# Patient Record
Sex: Male | Born: 1965 | Race: White | Hispanic: No | Marital: Married | Smoking: Current every day smoker
Health system: Southern US, Community
[De-identification: ages and names within clinical notes are randomized; demographics above are authoritative.]

## PROBLEM LIST (undated history)

## (undated) DIAGNOSIS — K5792 Diverticulitis of intestine, part unspecified, without perforation or abscess without bleeding: Secondary | ICD-10-CM

## (undated) DIAGNOSIS — E7800 Pure hypercholesterolemia, unspecified: Secondary | ICD-10-CM

## (undated) DIAGNOSIS — I10 Essential (primary) hypertension: Secondary | ICD-10-CM

## (undated) DIAGNOSIS — F32A Depression, unspecified: Secondary | ICD-10-CM

## (undated) DIAGNOSIS — F419 Anxiety disorder, unspecified: Secondary | ICD-10-CM

## (undated) DIAGNOSIS — F329 Major depressive disorder, single episode, unspecified: Secondary | ICD-10-CM

## (undated) DIAGNOSIS — T7840XA Allergy, unspecified, initial encounter: Secondary | ICD-10-CM

## (undated) DIAGNOSIS — K219 Gastro-esophageal reflux disease without esophagitis: Secondary | ICD-10-CM

## (undated) HISTORY — DX: Allergy, unspecified, initial encounter: T78.40XA

## (undated) HISTORY — DX: Gastro-esophageal reflux disease without esophagitis: K21.9

## (undated) HISTORY — DX: Diverticulitis of intestine, part unspecified, without perforation or abscess without bleeding: K57.92

## (undated) HISTORY — PX: OTHER SURGICAL HISTORY: SHX169

## (undated) HISTORY — PX: APPENDECTOMY: SHX54

---
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
## 2003-05-17 ENCOUNTER — Encounter: Payer: Self-pay | Admitting: Occupational Medicine

## 2003-05-17 ENCOUNTER — Encounter: Admission: RE | Admit: 2003-05-17 | Discharge: 2003-05-17 | Payer: Self-pay | Admitting: Occupational Medicine

## 2019-01-19 ENCOUNTER — Other Ambulatory Visit: Payer: Self-pay

## 2019-01-19 ENCOUNTER — Other Ambulatory Visit: Payer: Self-pay | Admitting: Behavioral Health

## 2019-01-19 ENCOUNTER — Encounter (HOSPITAL_COMMUNITY): Payer: Self-pay

## 2019-01-19 ENCOUNTER — Inpatient Hospital Stay (HOSPITAL_COMMUNITY)
Admission: RE | Admit: 2019-01-19 | Discharge: 2019-01-26 | DRG: 885 | Disposition: A | Discharge status: Home / Self Care | Payer: Commercial Managed Care - PPO | Attending: Psychiatry | Admitting: Psychiatry

## 2019-01-19 DIAGNOSIS — F32A Depression, unspecified: Secondary | ICD-10-CM | POA: Diagnosis present

## 2019-01-19 DIAGNOSIS — F1010 Alcohol abuse, uncomplicated: Secondary | ICD-10-CM | POA: Diagnosis not present

## 2019-01-19 DIAGNOSIS — R45851 Suicidal ideations: Secondary | ICD-10-CM | POA: Diagnosis present

## 2019-01-19 DIAGNOSIS — G4700 Insomnia, unspecified: Secondary | ICD-10-CM | POA: Diagnosis present

## 2019-01-19 DIAGNOSIS — I10 Essential (primary) hypertension: Secondary | ICD-10-CM | POA: Diagnosis present

## 2019-01-19 DIAGNOSIS — F322 Major depressive disorder, single episode, severe without psychotic features: Secondary | ICD-10-CM

## 2019-01-19 DIAGNOSIS — Z1159 Encounter for screening for other viral diseases: Secondary | ICD-10-CM

## 2019-01-19 DIAGNOSIS — F528 Other sexual dysfunction not due to a substance or known physiological condition: Secondary | ICD-10-CM | POA: Diagnosis present

## 2019-01-19 DIAGNOSIS — F329 Major depressive disorder, single episode, unspecified: Secondary | ICD-10-CM | POA: Diagnosis present

## 2019-01-19 DIAGNOSIS — F66 Other sexual disorders: Secondary | ICD-10-CM | POA: Diagnosis not present

## 2019-01-19 DIAGNOSIS — E785 Hyperlipidemia, unspecified: Secondary | ICD-10-CM | POA: Diagnosis present

## 2019-01-19 LAB — SARS CORONAVIRUS 2 BY RT PCR (HOSPITAL ORDER, PERFORMED IN ~~LOC~~ HOSPITAL LAB): SARS Coronavirus 2: NEGATIVE

## 2019-01-19 MED ORDER — ALUM & MAG HYDROXIDE-SIMETH 200-200-20 MG/5ML PO SUSP: 30.0000 mL | ORAL | Status: DC | PRN

## 2019-01-19 MED ORDER — FENOFIBRATE 160 MG PO TABS
160.0000 mg | ORAL_TABLET | Freq: Every day | ORAL | Status: DC
  Administered 2019-01-19 – 2019-01-26 (×8): 160 mg via ORAL
  Filled 2019-01-19 (×10): qty 1

## 2019-01-19 MED ORDER — LISINOPRIL-HYDROCHLOROTHIAZIDE 20-25 MG PO TABS: 1.0000 | ORAL_TABLET | Freq: Every day | ORAL | Status: DC

## 2019-01-19 MED ORDER — HYDROCHLOROTHIAZIDE 25 MG PO TABS
25.0000 mg | ORAL_TABLET | Freq: Every day | ORAL | Status: DC
  Administered 2019-01-19 – 2019-01-26 (×8): 25 mg via ORAL
  Filled 2019-01-19 (×11): qty 1

## 2019-01-19 MED ORDER — HYDROXYZINE HCL 25 MG PO TABS
25.0000 mg | ORAL_TABLET | Freq: Three times a day (TID) | ORAL | Status: DC | PRN
  Administered 2019-01-19: 25 mg via ORAL
  Filled 2019-01-19: qty 1

## 2019-01-19 MED ORDER — TRAZODONE HCL 50 MG PO TABS
50.0000 mg | ORAL_TABLET | Freq: Every evening | ORAL | Status: DC | PRN
  Administered 2019-01-19: 21:00:00 50 mg via ORAL
  Filled 2019-01-19: qty 1

## 2019-01-19 MED ORDER — ACETAMINOPHEN 325 MG PO TABS: 650.0000 mg | ORAL_TABLET | Freq: Four times a day (QID) | ORAL | Status: DC | PRN

## 2019-01-19 MED ORDER — LISINOPRIL 20 MG PO TABS
20.0000 mg | ORAL_TABLET | Freq: Every day | ORAL | Status: DC
  Administered 2019-01-19 – 2019-01-26 (×8): 20 mg via ORAL
  Filled 2019-01-19 (×10): qty 1
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
## 2019-01-19 NOTE — Plan of Care (Signed)
D: Patient is sitting on his bed and very anxious on approach. Patient is alert and cooperative. Denies SI, HI, AVH, and verbally contracts for safety. Patient blood pressure is elevated and provider on call contacted to get home meds ordered and administered. Patient denies physical symptoms/pain.    A: Medications administered per MD order. Support provided. Patient educated on safety on the unit and medications. Routine safety checks every 15 minutes. Patient stated understanding to tell nurse about any new physical symptoms. Patient understands to tell staff of any needs.     R: No adverse drug reactions noted. Patient verbally contracts for safety. Patient remains safe at this time and will continue to monitor.   Problem: Education: Goal: Knowledge of [facility] Education information/materials will improve Outcome: Progressing   Problem: Safety: Goal: Periods of time without injury will increase Outcome: Progressing  Patient is oriented to the unit. Patient remains safe and will continue to monitor.   [place] NOVEL CORONAVIRUS (COVID-19) DAILY CHECK-OFF SYMPTOMS - answer yes or no to each - every day NO YES  Have you had a fever in the past 24 hours?  Fever (Temp > 37.80C / 100F) X   Have you had any of these symptoms in the past 24 hours? New Cough  Sore Throat   Shortness of Breath  Difficulty Breathing  Unexplained Body Aches   X   Have you had any one of these symptoms in the past 24 hours not related to allergies?   Runny Nose  Nasal Congestion  Sneezing   X   If you have had runny nose, nasal congestion, sneezing in the past 24 hours, has it worsened?  X   EXPOSURES - check yes or no X   Have you traveled outside the state in the past 14 days?  X   Have you been in contact with someone with a confirmed diagnosis of COVID-19 or PUI in the past 14 days without wearing appropriate PPE?  X   Have you been living in the same home as a person with confirmed  diagnosis of COVID-19 or a PUI (household contact)?    X   Have you been diagnosed with COVID-19?    X              What to do next: Answered NO to all: Answered YES to anything:   Proceed with unit schedule Follow the [facility] Inpatient Flowsheet.

## 2019-01-20 DIAGNOSIS — F322 Major depressive disorder, single episode, severe without psychotic features: Principal | ICD-10-CM

## 2019-01-20 MED ORDER — VITAMIN B-1 100 MG PO TABS
100.0000 mg | ORAL_TABLET | Freq: Every day | ORAL | Status: DC
  Administered 2019-01-21 – 2019-01-26 (×6): 100 mg via ORAL
  Filled 2019-01-20 (×8): qty 1

## 2019-01-20 MED ORDER — CITALOPRAM HYDROBROMIDE 10 MG PO TABS
ORAL_TABLET | ORAL | Status: AC
  Filled 2019-01-20: qty 1

## 2019-01-20 MED ORDER — THIAMINE HCL 100 MG/ML IJ SOLN: 100.0000 mg | Freq: Once | INTRAMUSCULAR | Status: DC

## 2019-01-20 MED ORDER — ONDANSETRON 4 MG PO TBDP: 4.0000 mg | ORAL_TABLET | Freq: Four times a day (QID) | ORAL | Status: AC | PRN

## 2019-01-20 MED ORDER — CITALOPRAM HYDROBROMIDE 10 MG PO TABS
10.0000 mg | ORAL_TABLET | Freq: Every day | ORAL | Status: DC
  Administered 2019-01-20 – 2019-01-22 (×3): 10 mg via ORAL
  Filled 2019-01-20 (×5): qty 1

## 2019-01-20 MED ORDER — LORAZEPAM 1 MG PO TABS: 1.0000 mg | ORAL_TABLET | Freq: Four times a day (QID) | ORAL | Status: AC | PRN

## 2019-01-20 MED ORDER — LOPERAMIDE HCL 2 MG PO CAPS: 2.0000 mg | ORAL_CAPSULE | ORAL | Status: AC | PRN

## 2019-01-20 MED ORDER — TRAZODONE HCL 50 MG PO TABS
50.0000 mg | ORAL_TABLET | Freq: Every evening | ORAL | Status: DC | PRN
  Administered 2019-01-20 – 2019-01-25 (×6): 50 mg via ORAL
  Filled 2019-01-20 (×7): qty 1

## 2019-01-20 MED ORDER — ADULT MULTIVITAMIN W/MINERALS CH
1.0000 | ORAL_TABLET | Freq: Every day | ORAL | Status: DC
  Administered 2019-01-20 – 2019-01-26 (×7): 1 via ORAL
  Filled 2019-01-20 (×9): qty 1

## 2019-01-20 MED ORDER — HYDROXYZINE HCL 25 MG PO TABS
25.0000 mg | ORAL_TABLET | Freq: Four times a day (QID) | ORAL | Status: AC | PRN
  Administered 2019-01-20 – 2019-01-22 (×3): 25 mg via ORAL
  Filled 2019-01-20 (×4): qty 1

## 2019-01-20 NOTE — Progress Notes (Signed)
D Pt is observed by this Probation officer, he wears his own clothese. They are wrinkled and he says he slept in them last night. He endorses a flat, tearful affect6. HE avoids making eye contact with this Probation officer. He makes statements " it'll never be the same....people will always think differenlty of me..myalgias life is over".      A He has spent the majority of this day, up to the present sitting alone on the side of his bed. When writer checked on him, he does not asknowledge this writer's presence in the room, choosing to stare straight ahead. Writer shared her safety concerns with Dr Parke Poisson and we both sopoke to the patient in his room, together. ]    R Pt completed his daily assessment and on this he wrote he has experienced SI today . He says, quite flatly and blankly that he is not thinking of ways to kill himslef and he denies active SI, to both this Probation officer and to Dr Parke Poisson. HE rates his depression, hopelessness and anxeity " 8/10/6". He demonstrates little to no insight into his mental state. HE cannot look forward to anything, he cannot imagine his life, how he will feelel, what it will truly be like, say for example, n=tomorrow and / or even next week. He is hopeless and helpless. Staff to continue to contract with pt an dhceck on pt q 15 min and oprn to assure pt a=stays safe.
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
## 2019-01-20 NOTE — Final Progress Note (Signed)
[place] NOVEL CORONAVIRUS (COVID-19) DAILY CHECK-OFF SYMPTOMS - answer yes or no to each - every day NO YES  Have you had a fever in the past 24 hours?  . Fever (Temp > 37.80C / 100F) X   Have you had any of these symptoms in the past 24 hours? . New Cough .  Sore Throat  .  Shortness of Breath .  Difficulty Breathing .  Unexplained Body Aches   X   Have you had any one of these symptoms in the past 24 hours not related to allergies?   . Runny Nose .  Nasal Congestion .  Sneezing   X   If you have had runny nose, nasal congestion, sneezing in the past 24 hours, has it worsened?  X   EXPOSURES - check yes or no X   Have you traveled outside the state in the past 14 days?  X   Have you been in contact with someone with a confirmed diagnosis of COVID-19 or PUI in the past 14 days without wearing appropriate PPE?  X   Have you been living in the same home as a person with confirmed diagnosis of COVID-19 or a PUI (household contact)?    X   Have you been diagnosed with COVID-19?    X              What to do next: Answered NO to all: Answered YES to anything:   Proceed with unit schedule Follow the [facility] Inpatient Flowsheet.   

## 2019-01-20 NOTE — Tx Team (Signed)
Interdisciplinary Treatment and Diagnostic Plan Update  01/20/2019 Time of Session:  Victor Boyer MRN: [id number]  Principal Diagnosis: <principal problem not specified>  Secondary Diagnoses: Active Problems:   Depression   Current Medications:  Current Facility-Administered Medications  Medication Dose Route Frequency Provider Last Rate Last Dose  . acetaminophen (TYLENOL) tablet 650 mg  650 mg Oral Q6H PRN Thomas, Lashunda, NP      . alum & mag hydroxide-simeth (MAALOX/MYLANTA) 200-200-20 MG/5ML suspension 30 mL  30 mL Oral Q4H PRN Thomas, Lashunda, NP      . fenofibrate tablet 160 mg  160 mg Oral Daily Berry, Jason A, NP   160 mg at 01/20/19 0846  . lisinopril (ZESTRIL) tablet 20 mg  20 mg Oral Daily Kumar, Archana, MD   20 mg at 01/20/19 0846   And  . hydrochlorothiazide (HYDRODIURIL) tablet 25 mg  25 mg Oral Daily Kumar, Archana, MD   25 mg at 01/20/19 0846  . hydrOXYzine (ATARAX/VISTARIL) tablet 25 mg  25 mg Oral TID PRN Berry, Jason A, NP   25 mg at 01/19/19 2123  . traZODone (DESYREL) tablet 50 mg  50 mg Oral QHS PRN,MR X 1 Berry, Jason A, NP   50 mg at 01/19/19 2123   PTA Medications: Medications Prior to Admission  Medication Sig Dispense Refill Last Dose  . fenofibrate 160 MG tablet Take 160 mg by mouth daily. with food     . lisinopril-hydrochlorothiazide (ZESTORETIC) 20-25 MG tablet Take 1 tablet by mouth daily.       Patient Stressors: Financial difficulties Legal issue Traumatic event  Patient Strengths: Ability for insight Average or above average intelligence Communication skills Financial means Motivation for treatment/growth Physical Health Supportive family/friends  Treatment Modalities: Medication Management, Group therapy, Case management,  1 to 1 session with clinician, Psychoeducation, Recreational therapy.   Physician Treatment Plan for Primary Diagnosis: <principal problem not specified> Long Term Goal(s):     Short Term Goals:    Medication  Management: Evaluate patient's response, side effects, and tolerance of medication regimen.  Therapeutic Interventions: 1 to 1 sessions, Unit Group sessions and Medication administration.  Evaluation of Outcomes: Not Met  Physician Treatment Plan for Secondary Diagnosis: Active Problems:   Depression  Long Term Goal(s):     Short Term Goals:       Medication Management: Evaluate patient's response, side effects, and tolerance of medication regimen.  Therapeutic Interventions: 1 to 1 sessions, Unit Group sessions and Medication administration.  Evaluation of Outcomes: Not Met   RN Treatment Plan for Primary Diagnosis: <principal problem not specified> Long Term Goal(s): Knowledge of disease and therapeutic regimen to maintain health will improve  Short Term Goals: Ability to participate in decision making will improve, Ability to verbalize feelings will improve, Ability to disclose and discuss suicidal ideas and Ability to identify and develop effective coping behaviors will improve  Medication Management: RN will administer medications as ordered by provider, will assess and evaluate patient's response and provide education to patient for prescribed medication. RN will report any adverse and/or side effects to prescribing provider.  Therapeutic Interventions: 1 on 1 counseling sessions, Psychoeducation, Medication administration, Evaluate responses to treatment, Monitor vital signs and CBGs as ordered, Perform/monitor CIWA, COWS, AIMS and Fall Risk screenings as ordered, Perform wound care treatments as ordered.  Evaluation of Outcomes: Not Met   LCSW Treatment Plan for Primary Diagnosis: <principal problem not specified> Long Term Goal(s): Safe transition to appropriate next level of care at discharge, Engage patient in   therapeutic group addressing interpersonal concerns.  Short Term Goals: Engage patient in aftercare planning with referrals and resources  Therapeutic  Interventions: Assess for all discharge needs, 1 to 1 time with Social worker, Explore available resources and support systems, Assess for adequacy in community support network, Educate family and significant other(s) on suicide prevention, Complete Psychosocial Assessment, Interpersonal group therapy.  Evaluation of Outcomes: Not Met   Progress in Treatment: Attending groups: No. Participating in groups: No. Taking medication as prescribed: Yes. Toleration medication: Yes. Family/Significant other contact made: No, will contact:  if patient consents to collateral contacts Patient understands diagnosis: Yes. Discussing patient identified problems/goals with staff: Yes. Medical problems stabilized or resolved: Yes. Denies suicidal/homicidal ideation: Yes. Issues/concerns per patient self-inventory: No. Other:   New problem(s) identified: None   New Short Term/Long Term Goal(s): medication stabilization, elimination of SI thoughts, development of comprehensive mental wellness plan.    Patient Goals:    Discharge Plan or Barriers: Patient recently admitted. CSW will continue to follow and assess for appropriate referrals and possible discharge planning.    Reason for Continuation of Hospitalization: Depression Medication stabilization Suicidal ideation  Estimated Length of Stay: 3-5 days   Attendees: Patient: 01/20/2019 12:47 PM  Physician: Dr. Fernando Cobos, MD 01/20/2019 12:47 PM  Nursing: Patty.D, RN 01/20/2019 12:47 PM  RN Care Manager: 01/20/2019 12:47 PM  Social Worker: Jolan Williams, LCSWA 01/20/2019 12:47 PM  Recreational Therapist:  01/20/2019 12:47 PM  Other:  01/20/2019 12:47 PM  Other:  01/20/2019 12:47 PM  Other: 01/20/2019 12:47 PM    Scribe for Treatment Team: Jolan E Williams, LCSWA 01/20/2019 12:47 PM 
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
## 2019-01-21 DIAGNOSIS — F329 Major depressive disorder, single episode, unspecified: Secondary | ICD-10-CM

## 2019-01-21 LAB — CBC WITH DIFFERENTIAL/PLATELET
Abs Immature Granulocytes: 0.06 10*3/uL (ref 0.00–0.07)
Basophils Absolute: 0.1 10*3/uL (ref 0.0–0.1)
Basophils Relative: 1 %
Eosinophils Absolute: 0.3 10*3/uL (ref 0.0–0.5)
Eosinophils Relative: 3 %
HCT: 51.4 % (ref 39.0–52.0)
Hemoglobin: 16.7 g/dL (ref 13.0–17.0)
Immature Granulocytes: 1 %
Lymphocytes Relative: 22 %
Lymphs Abs: 2.8 10*3/uL (ref 0.7–4.0)
MCH: 30.5 pg (ref 26.0–34.0)
MCHC: 32.5 g/dL (ref 30.0–36.0)
MCV: 93.8 fL (ref 80.0–100.0)
Monocytes Absolute: 1.4 10*3/uL — ABNORMAL HIGH (ref 0.1–1.0)
Monocytes Relative: 11 %
Neutro Abs: 8 10*3/uL — ABNORMAL HIGH (ref 1.7–7.7)
Neutrophils Relative %: 62 %
Platelets: 357 10*3/uL (ref 150–400)
RBC: 5.48 MIL/uL (ref 4.22–5.81)
RDW: 13.1 % (ref 11.5–15.5)
WBC: 12.6 10*3/uL — ABNORMAL HIGH (ref 4.0–10.5)
nRBC: 0 % (ref 0.0–0.2)

## 2019-01-21 LAB — LIPID PANEL
Cholesterol: 203 mg/dL — ABNORMAL HIGH (ref 0–200)
HDL: 48 mg/dL (ref >40)
LDL Cholesterol: 128 mg/dL — ABNORMAL HIGH (ref 0–99)
Total CHOL/HDL Ratio: 4.2 RATIO
Triglycerides: 135 mg/dL (ref <150)
VLDL: 27 mg/dL (ref 0–40)

## 2019-01-21 LAB — COMPREHENSIVE METABOLIC PANEL
ALT: 16 U/L (ref 0–44)
AST: 14 U/L — ABNORMAL LOW (ref 15–41)
Albumin: 4.1 g/dL (ref 3.5–5.0)
Alkaline Phosphatase: 60 U/L (ref 38–126)
Anion gap: 12 (ref 5–15)
BUN: 24 mg/dL — ABNORMAL HIGH (ref 6–20)
CO2: 27 mmol/L (ref 22–32)
Calcium: 9.6 mg/dL (ref 8.9–10.3)
Chloride: 98 mmol/L (ref 98–111)
Creatinine, Ser: 0.99 mg/dL (ref 0.61–1.24)
GFR calc Af Amer: >60 mL/min (ref >60)
GFR calc non Af Amer: >60 mL/min (ref >60)
Glucose, Bld: 103 mg/dL — ABNORMAL HIGH (ref 70–99)
Potassium: 4.1 mmol/L (ref 3.5–5.1)
Sodium: 137 mmol/L (ref 135–145)
Total Bilirubin: 0.4 mg/dL (ref 0.3–1.2)
Total Protein: 7.5 g/dL (ref 6.5–8.1)

## 2019-01-21 LAB — RAPID URINE DRUG SCREEN, HOSP PERFORMED
Amphetamines: NOT DETECTED
Barbiturates: NOT DETECTED
Benzodiazepines: NOT DETECTED
Cocaine: NOT DETECTED
Opiates: NOT DETECTED
Tetrahydrocannabinol: NOT DETECTED

## 2019-01-21 LAB — URINALYSIS, COMPLETE (UACMP) WITH MICROSCOPIC
Bacteria, UA: NONE SEEN
Bilirubin Urine: NEGATIVE
Glucose, UA: NEGATIVE mg/dL
Ketones, ur: NEGATIVE mg/dL
Leukocytes,Ua: NEGATIVE
Nitrite: NEGATIVE
Protein, ur: NEGATIVE mg/dL
Specific Gravity, Urine: 1.018 (ref 1.005–1.030)
pH: 5 (ref 5.0–8.0)

## 2019-01-21 LAB — TSH: TSH: 2.882 u[IU]/mL (ref 0.350–4.500)

## 2019-01-21 MED ORDER — NICOTINE 21 MG/24HR TD PT24
MEDICATED_PATCH | TRANSDERMAL | Status: AC
  Filled 2019-01-21: qty 1

## 2019-01-21 MED ORDER — NICOTINE 21 MG/24HR TD PT24
21.0000 mg | MEDICATED_PATCH | Freq: Every day | TRANSDERMAL | Status: DC
  Administered 2019-01-21 – 2019-01-25 (×5): 21 mg via TRANSDERMAL
  Filled 2019-01-21 (×7): qty 1

## 2019-01-21 NOTE — Progress Notes (Signed)
D Pt is observed OOB UAL on the 400 hall today. He is in the dayroom and says to this Probation officer " its better todday". He asksed for a nicoderm patch and said " this will help me..ibuprofen know"> He remains acutely depressed and endorses a flat, depressed affect. He wears the same clothes he wore yesterday.     A He completed his daily assessment and on this he wrote he denied SI today and he rated his depression, hopelessness and anxiety " 8/8/4", respectively. He attended his Life SKills group today , was engaged in the conversation, made eye contact several times with this Probation officer as this Probation officer mediated this group and toelrated this discussion without repercussion. He denied having active SI today and he rated his dperession, hopelessness and anxiety " 8/8/4", respectively.     R Safety is in place Risk analyst to cont to forge therapeutic relationship with pt.

## 2019-01-21 NOTE — Progress Notes (Signed)
[place] NOVEL CORONAVIRUS (COVID-19) DAILY CHECK-OFF SYMPTOMS - answer yes or no to each - every day NO YES  Have you had a fever in the past 24 hours?  . Fever (Temp > 37.80C / 100F) X   Have you had any of these symptoms in the past 24 hours? . New Cough .  Sore Throat  .  Shortness of Breath .  Difficulty Breathing .  Unexplained Body Aches   X   Have you had any one of these symptoms in the past 24 hours not related to allergies?   . Runny Nose .  Nasal Congestion .  Sneezing   X   If you have had runny nose, nasal congestion, sneezing in the past 24 hours, has it worsened?  X   EXPOSURES - check yes or no X   Have you traveled outside the state in the past 14 days?  X   Have you been in contact with someone with a confirmed diagnosis of COVID-19 or PUI in the past 14 days without wearing appropriate PPE?  X   Have you been living in the same home as a person with confirmed diagnosis of COVID-19 or a PUI (household contact)?    X   Have you been diagnosed with COVID-19?    X              What to do next: Answered NO to all: Answered YES to anything:   Proceed with unit schedule Follow the [facility] Inpatient Flowsheet.   

## 2019-01-21 NOTE — Progress Notes (Addendum)
[facility] MD Progress Note  01/21/2019 4:03 PM Victor Boyer  MRN:  [id number] Subjective:  "I'm doing ok."  Victor Boyer found sitting in his room. He reports some improvement in mood. Range of affect is slightly improved. He has been in the dayroom for much of the day and has been participating in group therapy. He is interested in individual therapy after follow-up to address factors causing his depression. Denies current SI and contracts for safety. He was started on Celexa yesterday. He reports some mild dizziness but denies other side effects. He reports he has been drinking fluids and is steady on his feet. No withdrawal symptoms. Sleep reported as "fair." Record shows 6.75 hours of sleep. Appetite fair. He has been eating all of his meals (was only eating once a day at home).  From admission H&P: Victor Boyer is a 53 year old male with no reported psychiatric history, presenting voluntarily for treatment of suicidal ideation. He is tearful and declines to elaborate on reason for admission, other than to say that "something bad happened." Chart notes state that he was arrested on Monday for child pornography, and his two minor children were removed from the home in [place] custody. Patient reports being acutely depressed since the events earlier this week, with suicidal ideation, no plan.  Principal Problem: <principal problem not specified> Diagnosis: Active Problems:   Depression  Total Time spent with patient: 15 minutes  Past Psychiatric History: See admission H&P  Past Medical History: History reviewed. No pertinent past medical history. History reviewed. No pertinent surgical history. Family History: History reviewed. No pertinent family history. Family Psychiatric  History: See admission H&P Social History:  Social History   Substance and Sexual Activity  Alcohol Use Yes   Comment: 24 pack/week     Social History   Substance and Sexual Activity  Drug Use Never    Social History    Socioeconomic History  . Marital status: Married    Spouse name: Not on file  . Number of children: Not on file  . Years of education: Not on file  . Highest education level: Not on file  Occupational History  . Not on file  Social Needs  . Financial resource strain: Not on file  . Food insecurity    Worry: Not on file    Inability: Not on file  . Transportation needs    Medical: Not on file    Non-medical: Not on file  Tobacco Use  . Smoking status: Current Every Day Smoker    Packs/day: 1.00    Types: Cigarettes  . Smokeless tobacco: Never Used  Substance and Sexual Activity  . Alcohol use: Yes    Comment: 24 pack/week  . Drug use: Never  . Sexual activity: Yes    Birth control/protection: Coitus interruptus  Lifestyle  . Physical activity    Days per week: Not on file    Minutes per session: Not on file  . Stress: Not on file  Relationships  . Social Herbalist on phone: Not on file    Gets together: Not on file    Attends religious service: Not on file    Active member of club or organization: Not on file    Attends meetings of clubs or organizations: Not on file    Relationship status: Not on file  Other Topics Concern  . Not on file  Social History Narrative  . Not on file   Additional Social History:    Pain Medications:  see MAR Prescriptions: see MAR Over the Counter: see MAR History of alcohol / drug use?: No history of alcohol / drug abuse                    Sleep: Fair  Appetite:  Fair  Current Medications: Current Facility-Administered Medications  Medication Dose Route Frequency Provider Last Rate Last Dose  . acetaminophen (TYLENOL) tablet 650 mg  650 mg Oral Q6H PRN Mordecai Maes, NP      . alum & mag hydroxide-simeth (MAALOX/MYLANTA) 200-200-20 MG/5ML suspension 30 mL  30 mL Oral Q4H PRN Mordecai Maes, NP      . citalopram (CELEXA) tablet 10 mg  10 mg Oral Daily Tyjah Hai, Myer Peer, MD   10 mg at 01/21/19 0825  .  fenofibrate tablet 160 mg  160 mg Oral Daily Lindon Romp A, NP   160 mg at 01/21/19 0825  . lisinopril (ZESTRIL) tablet 20 mg  20 mg Oral Daily Hampton Abbot, MD   20 mg at 01/21/19 0825   And  . hydrochlorothiazide (HYDRODIURIL) tablet 25 mg  25 mg Oral Daily Hampton Abbot, MD   25 mg at 01/21/19 0826  . hydrOXYzine (ATARAX/VISTARIL) tablet 25 mg  25 mg Oral Q6H PRN Jaz Laningham, Myer Peer, MD   25 mg at 01/20/19 2124  . loperamide (IMODIUM) capsule 2-4 mg  2-4 mg Oral PRN Isack Lavalley, Myer Peer, MD      . LORazepam (ATIVAN) tablet 1 mg  1 mg Oral Q6H PRN Autum Benfer, Myer Peer, MD      . multivitamin with minerals tablet 1 tablet  1 tablet Oral Daily Raiya Stainback, Myer Peer, MD   1 tablet at 01/21/19 [phone]  . nicotine (NICODERM CQ - dosed in mg/24 hours) 21 mg/24hr patch           . nicotine (NICODERM CQ - dosed in mg/24 hours) patch 21 mg  21 mg Transdermal Daily Cabe Lashley, Myer Peer, MD   21 mg at 01/21/19 1133  . ondansetron (ZOFRAN-ODT) disintegrating tablet 4 mg  4 mg Oral Q6H PRN Sandeep Radell, Myer Peer, MD      . thiamine (B-1) injection 100 mg  100 mg Intramuscular Once Keano Guggenheim A, MD      . thiamine (VITAMIN B-1) tablet 100 mg  100 mg Oral Daily Rogelio Winbush, Myer Peer, MD   100 mg at 01/21/19 0825  . traZODone (DESYREL) tablet 50 mg  50 mg Oral QHS PRN Herminia Warren, Myer Peer, MD   50 mg at 01/20/19 2124    Lab Results:  Results for orders placed or performed during the hospital encounter of 01/19/19 (from the past 48 hour(s))  SARS Coronavirus 2 (CEPHEID - Performed in [facility] hospital lab), Hosp Order     Status: None   Collection Time: 01/19/19  5:27 PM   Specimen: Nasopharyngeal Swab  Result Value Ref Range   SARS Coronavirus 2 NEGATIVE NEGATIVE    Comment: (NOTE) If result is NEGATIVE SARS-CoV-2 target nucleic acids are NOT DETECTED. The SARS-CoV-2 RNA is generally detectable in upper and lower  respiratory specimens during the acute phase of infection. The lowest  concentration of SARS-CoV-2 viral  copies this assay can detect is 250  copies / mL. A negative result does not preclude SARS-CoV-2 infection  and should not be used as the sole basis for treatment or other  patient management decisions.  A negative result may occur with  improper specimen collection / handling, submission of specimen other  than nasopharyngeal  swab, presence of viral mutation(s) within the  areas targeted by this assay, and inadequate number of viral copies  (<250 copies / mL). A negative result must be combined with clinical  observations, patient history, and epidemiological information. If result is POSITIVE SARS-CoV-2 target nucleic acids are DETECTED. The SARS-CoV-2 RNA is generally detectable in upper and lower  respiratory specimens dur ing the acute phase of infection.  Positive  results are indicative of active infection with SARS-CoV-2.  Clinical  correlation with patient history and other diagnostic information is  necessary to determine patient infection status.  Positive results do  not rule out bacterial infection or co-infection with other viruses. If result is PRESUMPTIVE POSTIVE SARS-CoV-2 nucleic acids MAY BE PRESENT.   A presumptive positive result was obtained on the submitted specimen  and confirmed on repeat testing.  While 2019 novel coronavirus  (SARS-CoV-2) nucleic acids may be present in the submitted sample  additional confirmatory testing may be necessary for epidemiological  and / or clinical management purposes  to differentiate between  SARS-CoV-2 and other Sarbecovirus currently known to infect humans.  If clinically indicated additional testing with an alternate test  methodology [phone]) is advised. The SARS-CoV-2 RNA is generally  detectable in upper and lower respiratory sp ecimens during the acute  phase of infection. The expected result is Negative. Fact Sheet for Patients:  StrictlyIdeas.no Fact Sheet for Healthcare  Providers: BankingDealers.co.za This test is not yet approved or cleared by the [place] FDA and has been authorized for detection and/or diagnosis of SARS-CoV-2 by FDA under an Emergency Use Authorization (EUA).  This EUA will remain in effect (meaning this test can be used) for the duration of the COVID-19 declaration under Section 564(b)(1) of the Act, 21 U.S.C. section 360bbb-3(b)(1), unless the authorization is terminated or revoked sooner. Performed at [facility], [place] [address], [place], [place] [address]   Urine rapid drug screen (hosp performed)not at [facility]     Status: None   Collection Time: 01/20/19  6:09 PM  Result Value Ref Range   Opiates NONE DETECTED NONE DETECTED   Cocaine NONE DETECTED NONE DETECTED   Benzodiazepines NONE DETECTED NONE DETECTED   Amphetamines NONE DETECTED NONE DETECTED   Tetrahydrocannabinol NONE DETECTED NONE DETECTED   Barbiturates NONE DETECTED NONE DETECTED    Comment: (NOTE) DRUG SCREEN FOR MEDICAL PURPOSES ONLY.  IF CONFIRMATION IS NEEDED FOR ANY PURPOSE, NOTIFY LAB WITHIN 5 DAYS. LOWEST DETECTABLE LIMITS FOR URINE DRUG SCREEN Drug Class                     Cutoff (ng/mL) Amphetamine and metabolites    1000 Barbiturate and metabolites    200 Benzodiazepine                 384 Tricyclics and metabolites     300 Opiates and metabolites        300 Cocaine and metabolites        300 THC                            50 Performed at [facility], [place] [address], [place], [place] [address]   Urinalysis, Complete w Microscopic     Status: Abnormal   Collection Time: 01/20/19  6:09 PM  Result Value Ref Range   Color, Urine YELLOW YELLOW   APPearance CLEAR CLEAR   Specific Gravity, Urine 1.018 1.005 - 1.030  pH 5.0 5.0 - 8.0   Glucose, UA NEGATIVE NEGATIVE mg/dL   Hgb urine dipstick SMALL (A) NEGATIVE   Bilirubin Urine NEGATIVE NEGATIVE   Ketones, ur NEGATIVE NEGATIVE mg/dL    Protein, ur NEGATIVE NEGATIVE mg/dL   Nitrite NEGATIVE NEGATIVE   Leukocytes,Ua NEGATIVE NEGATIVE   RBC / HPF 0-5 0 - 5 RBC/hpf   WBC, UA 0-5 0 - 5 WBC/hpf   Bacteria, UA NONE SEEN NONE SEEN   Mucus PRESENT    Ca Oxalate Crys, UA PRESENT     Comment: Performed at [facility], [place] [address], [place], [place] [address]  Lipid panel     Status: Abnormal   Collection Time: 01/21/19  6:16 AM  Result Value Ref Range   Cholesterol 203 (H) 0 - 200 mg/dL   Triglycerides 135 <150 mg/dL   HDL 48 >40 mg/dL   Total CHOL/HDL Ratio 4.2 RATIO   VLDL 27 0 - 40 mg/dL   LDL Cholesterol 128 (H) 0 - 99 mg/dL    Comment:        Total Cholesterol/HDL:CHD Risk Coronary Heart Disease Risk Table                     Men   Women  1/2 Average Risk   3.4   3.3  Average Risk       5.0   4.4  2 X Average Risk   9.6   7.1  3 X Average Risk  23.4   11.0        Use the calculated Patient Ratio above and the CHD Risk Table to determine the patient's CHD Risk.        ATP III CLASSIFICATION (LDL):  <100     mg/dL   Optimal  100-129  mg/dL   Near or Above                    Optimal  130-159  mg/dL   Borderline  160-189  mg/dL   High  >190     mg/dL   Very High Performed at [place] [address], [place], [place] [address]   TSH     Status: None   Collection Time: 01/21/19  6:16 AM  Result Value Ref Range   TSH 2.882 0.350 - 4.500 uIU/mL    Comment: Performed by a 3rd Generation assay with a functional sensitivity of <=0.01 uIU/mL. Performed at [facility], [place] [address], [place], [place] [address]   CBC with Differential/Platelet     Status: Abnormal   Collection Time: 01/21/19  6:16 AM  Result Value Ref Range   WBC 12.6 (H) 4.0 - 10.5 K/uL   RBC 5.48 4.22 - 5.81 MIL/uL   Hemoglobin 16.7 13.0 - 17.0 g/dL   HCT 51.4 39.0 - 52.0 %   MCV 93.8 80.0 - 100.0 fL   MCH 30.5 26.0 - 34.0 pg   MCHC 32.5 30.0 - 36.0 g/dL   RDW 13.1 11.5 -  15.5 %   Platelets 357 150 - 400 K/uL   nRBC 0.0 0.0 - 0.2 %   Neutrophils Relative % 62 %   Neutro Abs 8.0 (H) 1.7 - 7.7 K/uL   Lymphocytes Relative 22 %   Lymphs Abs 2.8 0.7 - 4.0 K/uL   Monocytes Relative 11 %   Monocytes Absolute 1.4 (H) 0.1 - 1.0 K/uL   Eosinophils Relative 3 %   Eosinophils Absolute 0.3  0.0 - 0.5 K/uL   Basophils Relative 1 %   Basophils Absolute 0.1 0.0 - 0.1 K/uL   Immature Granulocytes 1 %   Abs Immature Granulocytes 0.06 0.00 - 0.07 K/uL    Comment: Performed at [facility], [place] [address] [place], [place] [address]  Comprehensive metabolic panel     Status: Abnormal   Collection Time: 01/21/19  6:16 AM  Result Value Ref Range   Sodium 137 135 - 145 mmol/L   Potassium 4.1 3.5 - 5.1 mmol/L   Chloride 98 98 - 111 mmol/L   CO2 27 22 - 32 mmol/L   Glucose, Bld 103 (H) 70 - 99 mg/dL   BUN 24 (H) 6 - 20 mg/dL   Creatinine, Ser 0.99 0.61 - 1.24 mg/dL   Calcium 9.6 8.9 - 10.3 mg/dL   Total Protein 7.5 6.5 - 8.1 g/dL   Albumin 4.1 3.5 - 5.0 g/dL   AST 14 (L) 15 - 41 U/L   ALT 16 0 - 44 U/L   Alkaline Phosphatase 60 38 - 126 U/L   Total Bilirubin 0.4 0.3 - 1.2 mg/dL   GFR calc non Af Amer >60 >60 mL/min   GFR calc Af Amer >60 >60 mL/min   Anion gap 12 5 - 15    Comment: Performed at [facility], [place] [address], [place], [place] [address]    Blood Alcohol level:  No results found for: [facility]  Metabolic Disorder Labs: No results found for: HGBA1C, MPG No results found for: PROLACTIN Lab Results  Component Value Date   CHOL 203 (H) 01/21/2019   TRIG 135 01/21/2019   HDL 48 01/21/2019   CHOLHDL 4.2 01/21/2019   VLDL 27 01/21/2019   LDLCALC 128 (H) 01/21/2019    Physical Findings: AIMS: Facial and Oral Movements Muscles of Facial Expression: None, normal Lips and Perioral Area: None, normal Jaw: None, normal Tongue: None, normal,Extremity Movements Upper (arms, wrists, hands, fingers): None, normal Lower  (legs, knees, ankles, toes): None, normal, Trunk Movements Neck, shoulders, hips: None, normal, Overall Severity Severity of abnormal movements (highest score from questions above): None, normal Incapacitation due to abnormal movements: None, normal Patient's awareness of abnormal movements (rate only patient's report): No Awareness, Dental Status Current problems with teeth and/or dentures?: Yes Does patient usually wear dentures?: No  CIWA:  CIWA-Ar Total: 2 COWS:     Musculoskeletal: Strength & Muscle Tone: within normal limits Gait & Station: normal Patient leans: N/A  Psychiatric Specialty Exam: Physical Exam  Nursing note and vitals reviewed. Constitutional: He is oriented to person, place, and time. He appears well-developed and well-nourished.  Cardiovascular: Normal rate.  Respiratory: Effort normal.  Neurological: He is alert and oriented to person, place, and time.    Review of Systems  Constitutional: Negative.   Psychiatric/Behavioral: Positive for depression and substance abuse. Negative for hallucinations and suicidal ideas. The patient is not nervous/anxious and does not have insomnia.     Blood pressure 126/74, pulse 83, temperature 98.9 F (37.2 C), temperature source Oral, resp. rate 18, height 5\' 10"  (1.778 m), weight 90.7 kg, SpO2 98 %.Body mass index is 28.7 kg/m.  General Appearance: Casual  Eye Contact:  Fair  Speech:  Clear and Coherent and Normal Rate  Volume:  Normal  Mood:  Depressed  Affect:  Congruent  Thought Process:  Coherent and Goal Directed  Orientation:  Full (Time, Place, and Person)  Thought Content:  Logical  Suicidal Thoughts:  Denies  Homicidal Thoughts:  Denies  Memory:  Immediate;   Fair Recent;   Fair  Judgement:  Intact  Insight:  Fair  Psychomotor Activity:  Decreased  Concentration:  Concentration: Good  Recall:  [place] of Knowledge:  Fair  Language:  Good  Akathisia:  No  Handed:  Right  AIMS (if indicated):      Assets:  Communication Skills Desire for Improvement Housing Social Support  ADL's:  Intact  Cognition:  WNL  Sleep:  Number of Hours: 6.75     Treatment Plan Summary: Daily contact with patient to assess and evaluate symptoms and progress in treatment and Medication management  Continue inpatient hospitalization.  Continue Celexa 10 mg PO daily for mood Continue fenofibrate 160 mg PO daily for HLD Continue lisinopril 20 mg PO daily for HTN Continue HCTZ 25 mg PO daily for HTN Continue Ativan 1 mg PO QH6HR PRN CIWA>10 Continue Vistaril 25 mg PO Q6HR PRN anxiety Continue thiamine 100 mg PO daily for supplementation Continue trazodone 50 mg PO QHS PRN insomnia  Patient will participate in the therapeutic group milieu.  Discharge disposition in progress.   Connye Burkitt, NP 01/21/2019, 4:03 PM   Agree with NP Progress Note
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
## 2019-01-21 NOTE — Progress Notes (Signed)
D: Patient observed in dayroom all evening. Keeps to self but engages when addressed. Patient forwards minimal information with this Probation officer. Patient's affect flat, sad with depressed mood.  Denies pain, physical complaints. COVID-19 screen negative, afebrile. Respiratory assessment WDL.  A: Medicated per orders, prn vistaril and trazadone given to promote sleep. Medication education provided. Level III obs in place for safety. Emotional support offered. Patient encouraged to complete Suicide Safety Plan before discharge. Encouraged to attend and participate in unit programming.   R: Patient verbalizes understanding of POC. On reassess, patient asleep. Patient denies SI/HI/AVH and remains safe on level III obs. He verbally contracts for safety with this Probation officer with sustained eye contact. Have been and will continue to monitor closely throughout the night as patient is high risk for self harm.

## 2019-01-21 NOTE — Progress Notes (Signed)
Adult Psychoeducational Group Note  Date:  01/21/2019 Time:  2:57 PM  Group Topic/Focus:  Goals Group:   The focus of this group is to help patients establish daily goals to achieve during treatment and discuss how the patient can incorporate goal setting into their daily lives to aide in recovery. Orientation:   The focus of this group is to educate the patient on the purpose and policies of crisis stabilization and provide a format to answer questions about their admission.  The group details unit policies and expectations of patients while admitted.  Participation Level:  Active  Participation Quality:  Appropriate  Affect:  Appropriate  Cognitive:  Alert  Insight: Appropriate  Engagement in Group:  Engaged  Modes of Intervention:  Discussion and Education  Additional Comments:    Pt participated in group with the MHT.   Lita Mains 01/21/2019, 2:57 PM

## 2019-01-21 NOTE — Progress Notes (Signed)
Adult Psychoeducational Group Note  Date:  01/21/2019 Time:  9:12 PM  Group Topic/Focus:  Wrap-Up Group:   The focus of this group is to help patients review their daily goal of treatment and discuss progress on daily workbooks.  Participation Level:  Minimal  Participation Quality:  Appropriate  Affect:  Flat  Cognitive:  Alert  Insight: Appropriate  Engagement in Group:  Engaged  Modes of Intervention:  Discussion  Additional Comments:  Pt stated that one strength is being strong. One struggle that the pt is facing is failing. One long term goal for the pt is to survive.   Victor Boyer 01/21/2019, 9:12 PM

## 2019-01-22 MED ORDER — CITALOPRAM HYDROBROMIDE 20 MG PO TABS
20.0000 mg | ORAL_TABLET | Freq: Every day | ORAL | Status: DC
  Administered 2019-01-23 – 2019-01-26 (×4): 20 mg via ORAL
  Filled 2019-01-22 (×6): qty 1
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
## 2019-01-22 NOTE — Progress Notes (Signed)
Adult Psychoeducational Group Note  Date:  01/22/2019 Time:  11:35 PM  Group Topic/Focus:  Wrap-Up Group:   The focus of this group is to help patients review their daily goal of treatment and discuss progress on daily workbooks.  Participation Level:  Minimal  Participation Quality:  Appropriate  Affect:  Flat  Cognitive:  Appropriate  Insight: Appropriate  Engagement in Group:  Engaged  Modes of Intervention:  Discussion  Additional Comments:  Pt participated minimally in group discussion on self-esteem.  Wynelle Fanny R 01/22/2019, 11:35 PM

## 2019-01-22 NOTE — Progress Notes (Signed)
[facility] NOVEL CORONAVIRUS (COVID-19) DAILY CHECK-OFF SYMPTOMS - answer yes or no to each - every day NO YES  Have you had a fever in the past 24 hours?  . Fever (Temp > 37.80C / 100F) X   Have you had any of these symptoms in the past 24 hours? . New Cough .  Sore Throat  .  Shortness of Breath .  Difficulty Breathing .  Unexplained Body Aches   X   Have you had any one of these symptoms in the past 24 hours not related to allergies?   . Runny Nose .  Nasal Congestion .  Sneezing   X   If you have had runny nose, nasal congestion, sneezing in the past 24 hours, has it worsened?  X   EXPOSURES - check yes or no X   Have you traveled outside the state in the past 14 days?  X   Have you been in contact with someone with a confirmed diagnosis of COVID-19 or PUI in the past 14 days without wearing appropriate PPE?  X   Have you been living in the same home as a person with confirmed diagnosis of COVID-19 or a PUI (household contact)?    X   Have you been diagnosed with COVID-19?    X              What to do next: Answered NO to all: Answered YES to anything:   Proceed with unit schedule Follow the [facility] Inpatient Flowsheet.   

## 2019-01-22 NOTE — Progress Notes (Signed)
[facility] MD Progress Note  01/22/2019 9:11 AM Victor Boyer  MRN:  [id number] Subjective: Patient reports she is feeling better than on admission.  He denies suicidal ideations.  Although he continues to ruminate about his stressors, states he feels his wife has been very supportive and states he has a good attorney to represent him.  Objective: I have reviewed chart notes and have met with patient. 53 year old married male, presented to [facility] voluntarily due to acute depression with passive SI and significant neurovegetative symptoms.  Facing significant stressors.  Had recently been charged with child renography possession and BSS had removed his minor children from household.  Today patient presents with partially improved mood and a more reactive affect.  He also presents with better eye contact and more communicative.  Today not tearful.  Denies suicidal ideations, as above identifies having good support system. He continues to describe a significant sense of embarrassment/guilt relating to above stressors.   Appetite, sleep improving partially.  More visible on unit, visible in dayroom, polite on approach, limited interaction with peers.  No agitated or disruptive behaviors.  Denies suicidal ideations.  Appears more future oriented and states "I realize I have to be strong"/ "realize that this is not the end of the world and things will get better"    Principal Problem:  Depression Diagnosis: MDD Total Time spent with patient: 20 minutes  Past Psychiatric History: See admission H&P  Past Medical History: History reviewed. No pertinent past medical history. History reviewed. No pertinent surgical history. Family History: History reviewed. No pertinent family history. Family Psychiatric  History: See admission H&P Social History:  Social History   Substance and Sexual Activity  Alcohol Use Yes   Comment: 24 pack/week     Social History   Substance and Sexual Activity  Drug Use Never     Social History   Socioeconomic History  . Marital status: Married    Spouse name: Not on file  . Number of children: Not on file  . Years of education: Not on file  . Highest education level: Not on file  Occupational History  . Not on file  Social Needs  . Financial resource strain: Not on file  . Food insecurity    Worry: Not on file    Inability: Not on file  . Transportation needs    Medical: Not on file    Non-medical: Not on file  Tobacco Use  . Smoking status: Current Every Day Smoker    Packs/day: 1.00    Types: Cigarettes  . Smokeless tobacco: Never Used  Substance and Sexual Activity  . Alcohol use: Yes    Comment: 24 pack/week  . Drug use: Never  . Sexual activity: Yes    Birth control/protection: Coitus interruptus  Lifestyle  . Physical activity    Days per week: Not on file    Minutes per session: Not on file  . Stress: Not on file  Relationships  . Social Herbalist on phone: Not on file    Gets together: Not on file    Attends religious service: Not on file    Active member of club or organization: Not on file    Attends meetings of clubs or organizations: Not on file    Relationship status: Not on file  Other Topics Concern  . Not on file  Social History Narrative  . Not on file   Additional Social History:    Pain Medications: see MAR Prescriptions:  see MAR Over the Counter: see MAR History of alcohol / drug use?: No history of alcohol / drug abuse   Sleep: Improving  Appetite:  Improving  Current Medications: Current Facility-Administered Medications  Medication Dose Route Frequency Provider Last Rate Last Dose  . acetaminophen (TYLENOL) tablet 650 mg  650 mg Oral Q6H PRN Mordecai Maes, NP      . alum & mag hydroxide-simeth (MAALOX/MYLANTA) 200-200-20 MG/5ML suspension 30 mL  30 mL Oral Q4H PRN Mordecai Maes, NP      . citalopram (CELEXA) tablet 10 mg  10 mg Oral Daily Milo Schreier, Myer Peer, MD   10 mg at 01/22/19 0837   . fenofibrate tablet 160 mg  160 mg Oral Daily Lindon Romp A, NP   160 mg at 01/22/19 0836  . lisinopril (ZESTRIL) tablet 20 mg  20 mg Oral Daily Hampton Abbot, MD   20 mg at 01/22/19 2244   And  . hydrochlorothiazide (HYDRODIURIL) tablet 25 mg  25 mg Oral Daily Hampton Abbot, MD   25 mg at 01/22/19 0837  . hydrOXYzine (ATARAX/VISTARIL) tablet 25 mg  25 mg Oral Q6H PRN Nariyah Osias, Myer Peer, MD   25 mg at 01/21/19 2111  . loperamide (IMODIUM) capsule 2-4 mg  2-4 mg Oral PRN Soma Bachand, Myer Peer, MD      . LORazepam (ATIVAN) tablet 1 mg  1 mg Oral Q6H PRN Esabella Stockinger, Myer Peer, MD      . multivitamin with minerals tablet 1 tablet  1 tablet Oral Daily Terriana Barreras, Myer Peer, MD   1 tablet at 01/22/19 0837  . nicotine (NICODERM CQ - dosed in mg/24 hours) patch 21 mg  21 mg Transdermal Daily Marchetta Navratil, Myer Peer, MD   21 mg at 01/22/19 0837  . ondansetron (ZOFRAN-ODT) disintegrating tablet 4 mg  4 mg Oral Q6H PRN Kallen Mccrystal, Myer Peer, MD      . thiamine (B-1) injection 100 mg  100 mg Intramuscular Once Jadda Hunsucker A, MD      . thiamine (VITAMIN B-1) tablet 100 mg  100 mg Oral Daily Ruby Dilone, Myer Peer, MD   100 mg at 01/22/19 0837  . traZODone (DESYREL) tablet 50 mg  50 mg Oral QHS PRN Normalee Sistare, Myer Peer, MD   50 mg at 01/21/19 2111    Lab Results:  Results for orders placed or performed during the hospital encounter of 01/19/19 (from the past 48 hour(s))  Urine rapid drug screen (hosp performed)not at [facility]     Status: None   Collection Time: 01/20/19  6:09 PM  Result Value Ref Range   Opiates NONE DETECTED NONE DETECTED   Cocaine NONE DETECTED NONE DETECTED   Benzodiazepines NONE DETECTED NONE DETECTED   Amphetamines NONE DETECTED NONE DETECTED   Tetrahydrocannabinol NONE DETECTED NONE DETECTED   Barbiturates NONE DETECTED NONE DETECTED    Comment: (NOTE) DRUG SCREEN FOR MEDICAL PURPOSES ONLY.  IF CONFIRMATION IS NEEDED FOR ANY PURPOSE, NOTIFY LAB WITHIN 5 DAYS. LOWEST DETECTABLE LIMITS FOR URINE DRUG  SCREEN Drug Class                     Cutoff (ng/mL) Amphetamine and metabolites    1000 Barbiturate and metabolites    200 Benzodiazepine                 975 Tricyclics and metabolites     300 Opiates and metabolites        300 Cocaine and metabolites  300 THC                            50 Performed at [facility], [place] [address] [place], [place] [address]   Urinalysis, Complete w Microscopic     Status: Abnormal   Collection Time: 01/20/19  6:09 PM  Result Value Ref Range   Color, Urine YELLOW YELLOW   APPearance CLEAR CLEAR   Specific Gravity, Urine 1.018 1.005 - 1.030   pH 5.0 5.0 - 8.0   Glucose, UA NEGATIVE NEGATIVE mg/dL   Hgb urine dipstick SMALL (A) NEGATIVE   Bilirubin Urine NEGATIVE NEGATIVE   Ketones, ur NEGATIVE NEGATIVE mg/dL   Protein, ur NEGATIVE NEGATIVE mg/dL   Nitrite NEGATIVE NEGATIVE   Leukocytes,Ua NEGATIVE NEGATIVE   RBC / HPF 0-5 0 - 5 RBC/hpf   WBC, UA 0-5 0 - 5 WBC/hpf   Bacteria, UA NONE SEEN NONE SEEN   Mucus PRESENT    Ca Oxalate Crys, UA PRESENT     Comment: Performed at [facility], [place] [address], [place], [place] [address]  Lipid panel     Status: Abnormal   Collection Time: 01/21/19  6:16 AM  Result Value Ref Range   Cholesterol 203 (H) 0 - 200 mg/dL   Triglycerides 135 <150 mg/dL   HDL 48 >40 mg/dL   Total CHOL/HDL Ratio 4.2 RATIO   VLDL 27 0 - 40 mg/dL   LDL Cholesterol 128 (H) 0 - 99 mg/dL    Comment:        Total Cholesterol/HDL:CHD Risk Coronary Heart Disease Risk Table                     Men   Women  1/2 Average Risk   3.4   3.3  Average Risk       5.0   4.4  2 X Average Risk   9.6   7.1  3 X Average Risk  23.4   11.0        Use the calculated Patient Ratio above and the CHD Risk Table to determine the patient's CHD Risk.        ATP III CLASSIFICATION (LDL):  <100     mg/dL   Optimal  100-129  mg/dL   Near or Above                    Optimal  130-159  mg/dL   Borderline   160-189  mg/dL   High  >190     mg/dL   Very High Performed at [facility] [address], [place], [place] [address]   TSH     Status: None   Collection Time: 01/21/19  6:16 AM  Result Value Ref Range   TSH 2.882 0.350 - 4.500 uIU/mL    Comment: Performed by a 3rd Generation assay with a functional sensitivity of <=0.01 uIU/mL. Performed at [facility], [place] [address] [place], [place] [address]   CBC with Differential/Platelet     Status: Abnormal   Collection Time: 01/21/19  6:16 AM  Result Value Ref Range   WBC 12.6 (H) 4.0 - 10.5 K/uL   RBC 5.48 4.22 - 5.81 MIL/uL   Hemoglobin 16.7 13.0 - 17.0 g/dL   HCT 51.4 39.0 - 52.0 %   MCV 93.8 80.0 - 100.0 fL   MCH 30.5 26.0 - 34.0 pg  MCHC 32.5 30.0 - 36.0 g/dL   RDW 13.1 11.5 - 15.5 %   Platelets 357 150 - 400 K/uL   nRBC 0.0 0.0 - 0.2 %   Neutrophils Relative % 62 %   Neutro Abs 8.0 (H) 1.7 - 7.7 K/uL   Lymphocytes Relative 22 %   Lymphs Abs 2.8 0.7 - 4.0 K/uL   Monocytes Relative 11 %   Monocytes Absolute 1.4 (H) 0.1 - 1.0 K/uL   Eosinophils Relative 3 %   Eosinophils Absolute 0.3 0.0 - 0.5 K/uL   Basophils Relative 1 %   Basophils Absolute 0.1 0.0 - 0.1 K/uL   Immature Granulocytes 1 %   Abs Immature Granulocytes 0.06 0.00 - 0.07 K/uL    Comment: Performed at [facility], [place] [address] [place], [place] [address]  Comprehensive metabolic panel     Status: Abnormal   Collection Time: 01/21/19  6:16 AM  Result Value Ref Range   Sodium 137 135 - 145 mmol/L   Potassium 4.1 3.5 - 5.1 mmol/L   Chloride 98 98 - 111 mmol/L   CO2 27 22 - 32 mmol/L   Glucose, Bld 103 (H) 70 - 99 mg/dL   BUN 24 (H) 6 - 20 mg/dL   Creatinine, Ser 0.99 0.61 - 1.24 mg/dL   Calcium 9.6 8.9 - 10.3 mg/dL   Total Protein 7.5 6.5 - 8.1 g/dL   Albumin 4.1 3.5 - 5.0 g/dL   AST 14 (L) 15 - 41 U/L   ALT 16 0 - 44 U/L   Alkaline Phosphatase 60 38 - 126 U/L   Total Bilirubin 0.4 0.3 - 1.2  mg/dL   GFR calc non Af Amer >60 >60 mL/min   GFR calc Af Amer >60 >60 mL/min   Anion gap 12 5 - 15    Comment: Performed at [facility], [place] [address] [place], [place] [address]    Blood Alcohol level:  No results found for: [facility]  Metabolic Disorder Labs: No results found for: HGBA1C, MPG No results found for: PROLACTIN Lab Results  Component Value Date   CHOL 203 (H) 01/21/2019   TRIG 135 01/21/2019   HDL 48 01/21/2019   CHOLHDL 4.2 01/21/2019   VLDL 27 01/21/2019   LDLCALC 128 (H) 01/21/2019    Physical Findings: AIMS: Facial and Oral Movements Muscles of Facial Expression: None, normal Lips and Perioral Area: None, normal Jaw: None, normal Tongue: None, normal,Extremity Movements Upper (arms, wrists, hands, fingers): None, normal Lower (legs, knees, ankles, toes): None, normal, Trunk Movements Neck, shoulders, hips: None, normal, Overall Severity Severity of abnormal movements (highest score from questions above): None, normal Incapacitation due to abnormal movements: None, normal Patient's awareness of abnormal movements (rate only patient's report): No Awareness, Dental Status Current problems with teeth and/or dentures?: Yes Does patient usually wear dentures?: No  CIWA:  CIWA-Ar Total: 0 COWS:     Musculoskeletal: Strength & Muscle Tone: within normal limits Gait & Station: normal Patient leans: N/A  Psychiatric Specialty Exam: Physical Exam  Nursing note and vitals reviewed. Constitutional: He is oriented to person, place, and time. He appears well-developed and well-nourished.  Cardiovascular: Normal rate.  Respiratory: Effort normal.  Neurological: He is alert and oriented to person, place, and time.    Review of Systems  Constitutional: Negative.   Psychiatric/Behavioral: Positive for depression and substance abuse. Negative for hallucinations and suicidal ideas. The patient is not nervous/anxious and does not have insomnia.    No  chest pain, no shortness of breath, no cough, no vomiting  Blood pressure 140/81, pulse 91, temperature 98.6 F (37 C), temperature source Oral, resp. rate 18, height 5' 10"  (1.778 m), weight 90.7 kg, SpO2 98 %.Body mass index is 28.7 kg/m.  General Appearance: Improved grooming  Eye Contact:  Good  Speech:  Normal Rate  Volume:  Decreased  Mood:  Remains depressed but states he is feeling better compared to admission  Affect:  Remains constricted but overall affect is improving compared to admission presentation  Thought Process:  Linear and Descriptions of Associations: Intact  Orientation:  Full (Time, Place, and Person)  Thought Content:  No hallucinations, no delusions  Suicidal Thoughts:  No currently denies suicidal ideations, denies self-injurious ideations, contracts for safety on unit  Homicidal Thoughts:  No  Memory:  Recent and remote grossly intact  Judgement:  Fair/improving  Insight:  Fair/improving  Psychomotor Activity:  Normal-more visible on unit, out of room more, no current agitation or restlessness  Concentration:  Concentration: Good and Attention Span: Good  Recall:  Good  Fund of Knowledge:  Good  Language:  Good  Akathisia:  No  Handed:  Right  AIMS (if indicated):     Assets:  Communication Skills Desire for Improvement Housing Social Support  ADL's:  Intact  Cognition:  WNL  Sleep:  Number of Hours: 6.75   Assessment:  53 year old married male, presented to [facility] voluntarily due to acute depression with passive SI and significant neurovegetative symptoms.  Facing significant stressors.  Had recently been charged with child renography possession and BSS had removed his minor children from household.  Today patient is presenting with partially improved mood and range of affect.  No current suicidal ideations and presents more future oriented.  At this time tolerating Celexa trial well.  Is not presenting with symptoms of alcohol withdrawal (no  tremors, no diaphoresis, no restlessness, vitals stable).  Treatment Plan Summary: Daily contact with patient to assess and evaluate symptoms and progress in treatment and Medication management Encourage group and milieu participation to work on coping skills and symptom reduction Increase Celexa to 20 mg PO daily for depression, anxiety Continue fenofibrate 160 mg PO daily for HLD Continue lisinopril 20 mg PO daily for HTN Continue HCTZ 25 mg PO daily for HTN Continue Ativan 1 mg PO QH6HR PRN CIWA>10 Continue Vistaril 25 mg PO Q6HR PRN anxiety Continue thiamine 100 mg PO daily for supplementation Continue Trazodone 50 mg PO QHS PRN insomnia Treatment team working on disposition planning options  Victor Campus, MD 01/22/2019, 9:11 AM   Patient ID: [name], male   DOB: Nov 30, 1965, 53 y.o.   MRN: [id number]

## 2019-01-22 NOTE — Progress Notes (Signed)
[place] NOVEL CORONAVIRUS (COVID-19) DAILY CHECK-OFF SYMPTOMS - answer yes or no to each - every day NO YES  Have you had a fever in the past 24 hours?  . Fever (Temp > 37.80C / 100F) X   Have you had any of these symptoms in the past 24 hours? . New Cough .  Sore Throat  .  Shortness of Breath .  Difficulty Breathing .  Unexplained Body Aches   X   Have you had any one of these symptoms in the past 24 hours not related to allergies?   . Runny Nose .  Nasal Congestion .  Sneezing   X   If you have had runny nose, nasal congestion, sneezing in the past 24 hours, has it worsened?  X   EXPOSURES - check yes or no X   Have you traveled outside the state in the past 14 days?  X   Have you been in contact with someone with a confirmed diagnosis of COVID-19 or PUI in the past 14 days without wearing appropriate PPE?  X   Have you been living in the same home as a person with confirmed diagnosis of COVID-19 or a PUI (household contact)?    X   Have you been diagnosed with COVID-19?    X              What to do next: Answered NO to all: Answered YES to anything:   Proceed with unit schedule Follow the [facility] Inpatient Flowsheet.   

## 2019-01-22 NOTE — [facility] Group Notes (Signed)
[place] LCSW Group Therapy Note  01/22/2019  10:00-11:00AM  Type of Therapy and Topic:  Group Therapy:  Adding Supports Including Yourself  Participation Level:  Active   Description of Group:  Patients in this group were introduced to the concept that additional supports including self-support are an essential part of recovery.  Patients listed what supports they believe they need to add to their lives to achieve their goals at discharge, and they listed such things as therapist, family, doctor, support groups, and service dog.  CSW described the  continuum of mental health/substance abuse services available and the group discussed the differences among these including support group, therapy group, 12-step group, Actuary, Transport planner, and such.  A song entitled "My Own Hero" was played and a group discussion ensued in which patients stated they could relate to the song and it inspired them to realize they have be willing to help themselves in order to succeed, because other people cannot achieve sobriety or stability for them.  A song was played called "I Am Enough" which led to a discussion about being willing to believe we are worth the effort of being a self-support.  Group members expressed appreciation for each other.  Therapeutic Goals: 1)  demonstrate the importance of being a key part of one's own support system 2)  discuss various available supports 3)  encourage patient to use music as part of their self-support and focus on goals 4)  elicit ideas from patients about supports that need to be added   Summary of Patient Progress:  The patient expressed that healthy supports in his life currently include his family and unhealthy supports include the internet.  He was very active throughout group and spoke specifically to other patients in support at various points.  He showed growth in his insight.  Therapeutic Modalities:   Motivational  Interviewing Activity  Maretta Los

## 2019-01-22 NOTE — Progress Notes (Signed)
D Pt is observed OOB UAL on the 400 hall this am. HE makes brief eye contact with this Probation officer. He wears his own clothes. HE takes his scheduled emds a s planned. He endorses a flat, depressed affect. His mood is low. He admits he is feeling " some better" getting out of his room. HE is observed sitting quietly in the dayroom this am./     A HE did complete his daily assessment and on this eh wrote he denied having SI today and he rated his depression, hopelessness and anxiety " 8/8/3", respectivley.     R Safety is in place.

## 2019-01-22 NOTE — Progress Notes (Signed)
D.  Pt pleasant on approach, no complaints voiced at this time.  Pt was positive for evening wrap up group, observed engaged in appropriate interaction with peers on the unit.  Pt denies SI/HI/AVH at this time.  A.  Support and encouragement offered, medication given as ordered.  R.  Pt remains safe on the unit, will continue to monitor.

## 2019-01-23 NOTE — Progress Notes (Signed)
D:  Patient denied SI and HI, contracts for safety.  Denied A/V hallucinations.  Denied pain. A:  Medications administered per MD orders.  Emotional support and encouragement given patient. R:  Safety maintained with 15 minute checks.  

## 2019-01-23 NOTE — Plan of Care (Signed)
Nurse discussed anxiety, depression and coping skills with patient.  

## 2019-01-23 NOTE — [facility] Group Notes (Signed)
LCSW Group Therapy Note 01/23/2019 1:56 PM  Type of Therapy and Topic: Group Therapy: Overcoming Obstacles  Participation Level: Active  Description of Group:  In this group patients will be encouraged to explore what they see as obstacles to their own wellness and recovery. They will be guided to discuss their thoughts, feelings, and behaviors related to these obstacles. The group will process together ways to cope with barriers, with attention given to specific choices patients can make. Each patient will be challenged to identify changes they are motivated to make in order to overcome their obstacles. This group will be process-oriented, with patients participating in exploration of their own experiences as well as giving and receiving support and challenge from other group members.  Therapeutic Goals: 1. Patient will identify personal and current obstacles as they relate to admission. 2. Patient will identify barriers that currently interfere with their wellness or overcoming obstacles.  3. Patient will identify feelings, thought process and behaviors related to these barriers. 4. Patient will identify two changes they are willing to make to overcome these obstacles:   Summary of Patient Progress  Victor Boyer was engaged and participated throughout the group session. Victor Boyer reports that his main obstacle is he is being triggered by his peers' life stories. He reports that a lot of his childhood trauma has "come up" since being in the hospital.    Therapeutic Modalities:  Cognitive Behavioral Therapy Solution Focused Therapy Motivational Interviewing Relapse Prevention Therapy   [place] Social Worker

## 2019-01-23 NOTE — Progress Notes (Signed)
D: Patient is alert, oriented, pleasant, and cooperative. Denies SI, HI, AVH, and verbally contracts for safety. Patient denies physical symptoms/pain.    A: Medications administered per MD order. Support provided. Patient educated on safety on the unit and medications. Routine safety checks every 15 minutes. Patient stated understanding to tell nurse about any new physical symptoms. Patient understands to tell staff of any needs.     R: No adverse drug reactions noted. Patient verbally contracts for safety. Patient remains safe at this time and will continue to monitor.     [place] NOVEL CORONAVIRUS (COVID-19) DAILY CHECK-OFF SYMPTOMS - answer yes or no to each - every day NO YES  Have you had a fever in the past 24 hours?  . Fever (Temp > 37.80C / 100F) X   Have you had any of these symptoms in the past 24 hours? . New Cough .  Sore Throat  .  Shortness of Breath .  Difficulty Breathing .  Unexplained Body Aches   X   Have you had any one of these symptoms in the past 24 hours not related to allergies?   . Runny Nose .  Nasal Congestion .  Sneezing   X   If you have had runny nose, nasal congestion, sneezing in the past 24 hours, has it worsened?  X   EXPOSURES - check yes or no X   Have you traveled outside the state in the past 14 days?  X   Have you been in contact with someone with a confirmed diagnosis of COVID-19 or PUI in the past 14 days without wearing appropriate PPE?  X   Have you been living in the same home as a person with confirmed diagnosis of COVID-19 or a PUI (household contact)?    X   Have you been diagnosed with COVID-19?    X              What to do next: Answered NO to all: Answered YES to anything:   Proceed with unit schedule Follow the [facility] Inpatient Flowsheet.

## 2019-01-23 NOTE — Progress Notes (Addendum)
[facility] MD Progress Note  01/23/2019 11:07 AM Victor Boyer  MRN:  [id number] Subjective:  "I'm ok."  Victor Boyer seen sitting in the dayroom. He continues to present with constricted affect, depressed mood, but less tearful today, improving eye contact. He has been active in unit milieu and interacting with peers appropriately. He reports Celexa has helped to "calm me down" and decrease ruminating thoughts. His wife remains supportive, and he has been speaking with his wife on the phone regarding follow-up treatment. They are interested in residential treatment after discharge. He denies feeling that he has any sexual addiction or compulsions. He does endorse continuing suicidal ideation but denies plan or intent. He states he feels less hopeless than he did on admission and appears more future-oriented. Reports good sleep and appetite. Denies medication side effects.  From admission H&P: Victor Boyer is a 53 year old male presenting voluntarily for treatment of suicidal ideation. He was arrested on Monday for child pornography, and his two minor children were removed from the home in [place] custody. Patient reports being acutely depressed since the events earlier this week, with suicidal ideation, no plan.  Principal Problem: <principal problem not specified> Diagnosis: Active Problems:   Depression  Total Time spent with patient: 15 minutes  Past Psychiatric History: See admission H&P  Past Medical History: History reviewed. No pertinent past medical history. History reviewed. No pertinent surgical history. Family History: History reviewed. No pertinent family history. Family Psychiatric  History: See admission H&P Social History:  Social History   Substance and Sexual Activity  Alcohol Use Yes   Comment: 24 pack/week     Social History   Substance and Sexual Activity  Drug Use Never    Social History   Socioeconomic History  . Marital status: Married    Spouse name: Not on file  . Number of  children: Not on file  . Years of education: Not on file  . Highest education level: Not on file  Occupational History  . Not on file  Social Needs  . Financial resource strain: Not on file  . Food insecurity    Worry: Not on file    Inability: Not on file  . Transportation needs    Medical: Not on file    Non-medical: Not on file  Tobacco Use  . Smoking status: Current Every Day Smoker    Packs/day: 1.00    Types: Cigarettes  . Smokeless tobacco: Never Used  Substance and Sexual Activity  . Alcohol use: Yes    Comment: 24 pack/week  . Drug use: Never  . Sexual activity: Yes    Birth control/protection: Coitus interruptus  Lifestyle  . Physical activity    Days per week: Not on file    Minutes per session: Not on file  . Stress: Not on file  Relationships  . Social Herbalist on phone: Not on file    Gets together: Not on file    Attends religious service: Not on file    Active member of club or organization: Not on file    Attends meetings of clubs or organizations: Not on file    Relationship status: Not on file  Other Topics Concern  . Not on file  Social History Narrative  . Not on file   Additional Social History:    Pain Medications: see MAR Prescriptions: see MAR Over the Counter: see MAR History of alcohol / drug use?: No history of alcohol / drug abuse  Sleep: Good  Appetite:  Good  Current Medications: Current Facility-Administered Medications  Medication Dose Route Frequency Provider Last Rate Last Dose  . acetaminophen (TYLENOL) tablet 650 mg  650 mg Oral Q6H PRN Mordecai Maes, NP      . alum & mag hydroxide-simeth (MAALOX/MYLANTA) 200-200-20 MG/5ML suspension 30 mL  30 mL Oral Q4H PRN Mordecai Maes, NP      . citalopram (CELEXA) tablet 20 mg  20 mg Oral Daily Cobos, Myer Peer, MD   20 mg at 01/23/19 0754  . fenofibrate tablet 160 mg  160 mg Oral Daily Lindon Romp A, NP   160 mg at 01/23/19 0754  .  lisinopril (ZESTRIL) tablet 20 mg  20 mg Oral Daily Hampton Abbot, MD   20 mg at 01/23/19 0755   And  . hydrochlorothiazide (HYDRODIURIL) tablet 25 mg  25 mg Oral Daily Hampton Abbot, MD   25 mg at 01/23/19 0754  . hydrOXYzine (ATARAX/VISTARIL) tablet 25 mg  25 mg Oral Q6H PRN Cobos, Myer Peer, MD   25 mg at 01/22/19 2137  . loperamide (IMODIUM) capsule 2-4 mg  2-4 mg Oral PRN Cobos, Myer Peer, MD      . LORazepam (ATIVAN) tablet 1 mg  1 mg Oral Q6H PRN Cobos, Myer Peer, MD      . multivitamin with minerals tablet 1 tablet  1 tablet Oral Daily Cobos, Myer Peer, MD   1 tablet at 01/23/19 0755  . nicotine (NICODERM CQ - dosed in mg/24 hours) patch 21 mg  21 mg Transdermal Daily Cobos, Myer Peer, MD   21 mg at 01/23/19 0755  . ondansetron (ZOFRAN-ODT) disintegrating tablet 4 mg  4 mg Oral Q6H PRN Cobos, Myer Peer, MD      . thiamine (B-1) injection 100 mg  100 mg Intramuscular Once Cobos, Fernando A, MD      . thiamine (VITAMIN B-1) tablet 100 mg  100 mg Oral Daily Cobos, Myer Peer, MD   100 mg at 01/23/19 0755  . traZODone (DESYREL) tablet 50 mg  50 mg Oral QHS PRN Cobos, Myer Peer, MD   50 mg at 01/22/19 2137    Lab Results: No results found for this or any previous visit (from the past 48 hour(s)).  Blood Alcohol level:  No results found for: [facility]  Metabolic Disorder Labs: No results found for: HGBA1C, MPG No results found for: PROLACTIN Lab Results  Component Value Date   CHOL 203 (H) 01/21/2019   TRIG 135 01/21/2019   HDL 48 01/21/2019   CHOLHDL 4.2 01/21/2019   VLDL 27 01/21/2019   LDLCALC 128 (H) 01/21/2019    Physical Findings: AIMS: Facial and Oral Movements Muscles of Facial Expression: None, normal Lips and Perioral Area: None, normal Jaw: None, normal Tongue: None, normal,Extremity Movements Upper (arms, wrists, hands, fingers): None, normal Lower (legs, knees, ankles, toes): None, normal, Trunk Movements Neck, shoulders, hips: None, normal, Overall  Severity Severity of abnormal movements (highest score from questions above): None, normal Incapacitation due to abnormal movements: None, normal Patient's awareness of abnormal movements (rate only patient's report): No Awareness, Dental Status Current problems with teeth and/or dentures?: Yes Does patient usually wear dentures?: No  CIWA:  CIWA-Ar Total: 2 COWS:     Musculoskeletal: Strength & Muscle Tone: within normal limits Gait & Station: normal Patient leans: N/A  Psychiatric Specialty Exam: Physical Exam  Nursing note and vitals reviewed. Constitutional: He is oriented to person, place, and time. He appears well-developed and well-nourished.  Cardiovascular: Normal rate.  Respiratory: Effort normal.  Neurological: He is alert and oriented to person, place, and time.    Review of Systems  Constitutional: Negative.   Psychiatric/Behavioral: Positive for depression and suicidal ideas. Negative for hallucinations and substance abuse. The patient is not nervous/anxious and does not have insomnia.     Blood pressure 136/81, pulse 75, temperature 98 F (36.7 C), temperature source Oral, resp. rate 18, height 5\' 10"  (1.778 m), weight 90.7 kg, SpO2 98 %.Body mass index is 28.7 kg/m.  General Appearance: Casual  Eye Contact:  Fair  Speech:  Clear and Coherent and Slow  Volume:  Decreased  Mood:  Depressed  Affect:  Congruent and Constricted  Thought Process:  Coherent  Orientation:  Full (Time, Place, and Person)  Thought Content:  Logical  Suicidal Thoughts:  Yes.  without intent/plan  Homicidal Thoughts:  No  Memory:  Immediate;   Fair Recent;   Fair  Judgement:  Fair  Insight:  Fair  Psychomotor Activity:  Normal  Concentration:  Concentration: Good  Recall:  [place] of Knowledge:  Fair  Language:  Fair  Akathisia:  No  Handed:  Right  AIMS (if indicated):     Assets:  Communication Skills Desire for Improvement Housing Social Support  ADL's:  Intact   Cognition:  WNL  Sleep:  Number of Hours: 6.75     Treatment Plan Summary: Daily contact with patient to assess and evaluate symptoms and progress in treatment and Medication management   Continue inpatient hospitalization.  Continue Celexa 20 mg PO daily for depression/anxiety Continue fenofibrate 160 mg PO daily for HLD Continue lisinopril 20 mg PO daily for HTN Continue HCTZ 25 mg PO daily for HTN Continue thiamine 100 mg PO daily for supplementation Continue Vistaril 25 mg PO Q6HR PRN anxiety Continue trazodone 50 mg PO QHS PRN insomnia  Patient will participate in the therapeutic group milieu.  Discharge disposition in progress.   Connye Burkitt, NP 01/23/2019, 11:07 AM   Agree with NP Progress Note

## 2019-01-24 DIAGNOSIS — F1010 Alcohol abuse, uncomplicated: Secondary | ICD-10-CM

## 2019-01-24 DIAGNOSIS — F66 Other sexual disorders: Secondary | ICD-10-CM

## 2019-01-24 NOTE — Progress Notes (Signed)
D:  Patient's self inventory sheet, patient sleeps good, sleep medication helpful.  Good appetite, normal energy level, good concentration.  Rated depression 2, hopeless 9, anxiety 3.  Denied withdrawals.  Denied SI.  Denied physical problems.  Denied physical pain.  Goal is discharge, get started with help.  Plans to talk to MD.   A:  Medications administered per MD orders.  Emotional support and encouragement given patient. R:  Denied SI and HI, contracts for safety.  Denied A/V hallucinations.  Safety maintained with 15 minute checks.

## 2019-01-24 NOTE — Plan of Care (Signed)
Nurse discussed anxiety, depression and coping skills with patient.  

## 2019-01-24 NOTE — Progress Notes (Signed)
Patient stated having a good day. Patient's goal for today was to go home, to be with his family. Patient will be discharging tomorrow or Thursday.

## 2019-01-24 NOTE — [facility] Suicide Risk Assessment (Signed)
[place] INPATIENT:  Family/Significant Other Suicide Prevention Education  Suicide Prevention Education:  Education Completed; with wife, April Radigan [phone]) (home) or [phone] (cell) has been identified by the patient as the family member/significant other with whom the patient will be residing, and identified as the person(s) who will aid the patient in the event of a mental health crisis (suicidal ideations/suicide attempt).  With written consent from the patient, the family member/significant other has been provided the following suicide prevention education, prior to the and/or following the discharge of the patient.  The suicide prevention education provided includes the following:  Suicide risk factors  Suicide prevention and interventions  National Suicide Hotline telephone number  [facility] assessment telephone number  [facility] Emergency Assistance [place] and/or Residential Mobile Crisis Unit telephone number  Request made of family/significant other to:  Remove weapons (e.g., guns, rifles, knives), all items previously/currently identified as safety concern.    Remove drugs/medications (over-the-counter, prescriptions, illicit drugs), all items previously/currently identified as a safety concern.  The family member/significant other verbalizes understanding of the suicide prevention education information provided.  The family member/significant other agrees to remove the items of safety concern listed above.  April reports that the patient explained to her that he has experienced an increase in anxiety while being in the hospital due to being triggered by his peers' stories. She also shared that she and the patient were agreeable with a referral to [facility] and resources for additional community services and support.   April states she will continue to be supportive of the patient and that at discharge  the patient will return home with her. CSW will continue to follow.   Marylee Floras 01/24/2019, 9:56 AM

## 2019-01-24 NOTE — Progress Notes (Signed)
[facility] MD Progress Note  01/24/2019 2:17 PM Victor Boyer  MRN:  [id number] Subjective: Patient is a 53 year old male who was admitted on 6/25 secondary to suicidal ideation.  The patient had been arrested the day prior to admission secondary to child sonography issues.  He had never suffered suicidal ideation previously.  There was great concern that he would kill himself.  He also had been drinking 6-8 beers per night.  Objective: Patient is seen and examined.  Patient is a 53 year old male with the above-stated past psychiatric history who is seen in follow-up.  He stated he feels a little bit better today.  He stated that his suicidal ideation was reduced.  He was feeling better because his wife had agreed to allow him to come home.  He stated he was having a very difficult time talking in groups about his issues with the pornography because of fear and shame.  We discussed that.  He denied any alcohol withdrawal syndrome.  He denied any side effects to his current medications.  Principal Problem: <principal problem not specified> Diagnosis: Active Problems:   Depression  Total Time spent with patient: 20 minutes  Past Psychiatric History: See admission H&P  Past Medical History: History reviewed. No pertinent past medical history. History reviewed. No pertinent surgical history. Family History: History reviewed. No pertinent family history. Family Psychiatric  History: See admission H&P Social History:  Social History   Substance and Sexual Activity  Alcohol Use Yes   Comment: 24 pack/week     Social History   Substance and Sexual Activity  Drug Use Never    Social History   Socioeconomic History  . Marital status: Married    Spouse name: Not on file  . Number of children: Not on file  . Years of education: Not on file  . Highest education level: Not on file  Occupational History  . Not on file  Social Needs  . Financial resource strain: Not on file  . Food insecurity   Worry: Not on file    Inability: Not on file  . Transportation needs    Medical: Not on file    Non-medical: Not on file  Tobacco Use  . Smoking status: Current Every Day Smoker    Packs/day: 1.00    Types: Cigarettes  . Smokeless tobacco: Never Used  Substance and Sexual Activity  . Alcohol use: Yes    Comment: 24 pack/week  . Drug use: Never  . Sexual activity: Yes    Birth control/protection: Coitus interruptus  Lifestyle  . Physical activity    Days per week: Not on file    Minutes per session: Not on file  . Stress: Not on file  Relationships  . Social Herbalist on phone: Not on file    Gets together: Not on file    Attends religious service: Not on file    Active member of club or organization: Not on file    Attends meetings of clubs or organizations: Not on file    Relationship status: Not on file  Other Topics Concern  . Not on file  Social History Narrative  . Not on file   Additional Social History:    Pain Medications: see MAR Prescriptions: see MAR Over the Counter: see MAR History of alcohol / drug use?: No history of alcohol / drug abuse                    Sleep: Good  Appetite:  Fair  Current Medications: Current Facility-Administered Medications  Medication Dose Route Frequency Provider Last Rate Last Dose  . acetaminophen (TYLENOL) tablet 650 mg  650 mg Oral Q6H PRN Mordecai Maes, NP      . alum & mag hydroxide-simeth (MAALOX/MYLANTA) 200-200-20 MG/5ML suspension 30 mL  30 mL Oral Q4H PRN Mordecai Maes, NP      . citalopram (CELEXA) tablet 20 mg  20 mg Oral Daily Cobos, Myer Peer, MD   20 mg at 01/24/19 0731  . fenofibrate tablet 160 mg  160 mg Oral Daily Lindon Romp A, NP   160 mg at 01/24/19 0731  . lisinopril (ZESTRIL) tablet 20 mg  20 mg Oral Daily Hampton Abbot, MD   20 mg at 01/24/19 0732   And  . hydrochlorothiazide (HYDRODIURIL) tablet 25 mg  25 mg Oral Daily Hampton Abbot, MD   25 mg at 01/24/19 0732  .  multivitamin with minerals tablet 1 tablet  1 tablet Oral Daily Cobos, Myer Peer, MD   1 tablet at 01/24/19 0732  . nicotine (NICODERM CQ - dosed in mg/24 hours) patch 21 mg  21 mg Transdermal Daily Cobos, Myer Peer, MD   21 mg at 01/24/19 0732  . thiamine (B-1) injection 100 mg  100 mg Intramuscular Once Cobos, Fernando A, MD      . thiamine (VITAMIN B-1) tablet 100 mg  100 mg Oral Daily Cobos, Myer Peer, MD   100 mg at 01/24/19 0733  . traZODone (DESYREL) tablet 50 mg  50 mg Oral QHS PRN Cobos, Myer Peer, MD   50 mg at 01/23/19 2114    Lab Results: No results found for this or any previous visit (from the past 48 hour(s)).  Blood Alcohol level:  No results found for: [facility]  Metabolic Disorder Labs: No results found for: HGBA1C, MPG No results found for: PROLACTIN Lab Results  Component Value Date   CHOL 203 (H) 01/21/2019   TRIG 135 01/21/2019   HDL 48 01/21/2019   CHOLHDL 4.2 01/21/2019   VLDL 27 01/21/2019   LDLCALC 128 (H) 01/21/2019    Physical Findings: AIMS: Facial and Oral Movements Muscles of Facial Expression: None, normal Lips and Perioral Area: None, normal Jaw: None, normal Tongue: None, normal,Extremity Movements Upper (arms, wrists, hands, fingers): None, normal Lower (legs, knees, ankles, toes): None, normal, Trunk Movements Neck, shoulders, hips: None, normal, Overall Severity Severity of abnormal movements (highest score from questions above): None, normal Incapacitation due to abnormal movements: None, normal Patient's awareness of abnormal movements (rate only patient's report): No Awareness, Dental Status Current problems with teeth and/or dentures?: No Does patient usually wear dentures?: No  CIWA:  CIWA-Ar Total: 1 COWS:  COWS Total Score: 1  Musculoskeletal: Strength & Muscle Tone: within normal limits Gait & Station: normal Patient leans: N/A  Psychiatric Specialty Exam: Physical Exam  Nursing note and vitals reviewed. Constitutional: He  is oriented to person, place, and time. He appears well-developed and well-nourished.  HENT:  Head: Normocephalic and atraumatic.  Respiratory: Effort normal.  Neurological: He is alert and oriented to person, place, and time.    ROS  Blood pressure 131/86, pulse 78, temperature 97.8 F (36.6 C), temperature source Oral, resp. rate 18, height 5\' 10"  (1.778 m), weight 90.7 kg, SpO2 97 %.Body mass index is 28.7 kg/m.  General Appearance: Casual  Eye Contact:  Fair  Speech:  Normal Rate  Volume:  Decreased  Mood:  Anxious and Depressed  Affect:  Congruent  Thought Process:  Coherent and Descriptions of Associations: Circumstantial  Orientation:  Full (Time, Place, and Person)  Thought Content:  Logical  Suicidal Thoughts:  No  Homicidal Thoughts:  No  Memory:  Immediate;   Fair Recent;   Fair Remote;   Fair  Judgement:  Intact  Insight:  Fair  Psychomotor Activity:  Psychomotor Retardation  Concentration:  Concentration: Fair and Attention Span: Fair  Recall:  [employer] of Knowledge:  Fair  Language:  Good  Akathisia:  Negative  Handed:  Right  AIMS (if indicated):     Assets:  Desire for Improvement Resilience  ADL's:  Intact  Cognition:  WNL  Sleep:  Number of Hours: 6.75     Treatment Plan Summary: Daily contact with patient to assess and evaluate symptoms and progress in treatment, Medication management and Plan Patient is a 53 year old male with the above-stated past psychiatric history who is seen in follow-up.  Diagnosis: #1 major depression, recurrent, severe without psychotic features, #2 pornography addiction.  #3 hypertension, #4 hyperlipidemia, #5 alcohol abuse  Patient is seen in follow-up.  His mood is improving to a degree.  I have increased his citalopram to 20 mg p.o. daily today.  He denied any suicidal ideation or side effects to his medications.  He is still depressed.  He has a great difficulty in discussing the child sonography issues because of fear  and shame.  He has not been able to talk about it in group.  He has not shown any signs or symptoms of alcohol withdrawal so far.  We discussed the outpatient programs that would be available for him, and when his mood improves and we can get him involved in 1 of those programs we will proceed with disposition. 1.  Increase citalopram to 20 mg p.o. daily for mood and anxiety. 2.  Continue fenofibrate 160 mg p.o. daily for hyperlipidemia. 3.  Continue lisinopril 20 mg p.o. daily for hypertension. 4.  Continue hydrochlorothiazide 25 mg p.o. daily for hypertension. 5.  Continue thiamine 100 mg p.o. daily for nutritional supplementation. 6.  Continue trazodone 50 mg p.o. nightly as needed insomnia. 7.  Disposition planning-in progress.  Sharma Covert, MD 01/24/2019, 2:17 PM

## 2019-01-25 DIAGNOSIS — F66 Other sexual disorders: Secondary | ICD-10-CM

## 2019-01-25 DIAGNOSIS — F322 Major depressive disorder, single episode, severe without psychotic features: Secondary | ICD-10-CM

## 2019-01-25 DIAGNOSIS — F1010 Alcohol abuse, uncomplicated: Secondary | ICD-10-CM

## 2019-01-25 NOTE — Tx Team (Signed)
Interdisciplinary Treatment and Diagnostic Plan Update  01/25/2019 Time of Session:  Victor Boyer MRN: [id number]  Principal Diagnosis: <principal problem not specified>  Secondary Diagnoses: Active Problems:   Depression   Current Medications:  Current Facility-Administered Medications  Medication Dose Route Frequency Provider Last Rate Last Dose  . acetaminophen (TYLENOL) tablet 650 mg  650 mg Oral Q6H PRN Mordecai Maes, NP      . alum & mag hydroxide-simeth (MAALOX/MYLANTA) 200-200-20 MG/5ML suspension 30 mL  30 mL Oral Q4H PRN Mordecai Maes, NP      . citalopram (CELEXA) tablet 20 mg  20 mg Oral Daily Cobos, Myer Peer, MD   20 mg at 01/25/19 5361  . fenofibrate tablet 160 mg  160 mg Oral Daily Lindon Romp A, NP   160 mg at 01/25/19 4431  . lisinopril (ZESTRIL) tablet 20 mg  20 mg Oral Daily Hampton Abbot, MD   20 mg at 01/25/19 5400   And  . hydrochlorothiazide (HYDRODIURIL) tablet 25 mg  25 mg Oral Daily Hampton Abbot, MD   25 mg at 01/25/19 8676  . multivitamin with minerals tablet 1 tablet  1 tablet Oral Daily Cobos, Myer Peer, MD   1 tablet at 01/25/19 [phone]  . nicotine (NICODERM CQ - dosed in mg/24 hours) patch 21 mg  21 mg Transdermal Daily Cobos, Myer Peer, MD   21 mg at 01/25/19 0827  . thiamine (B-1) injection 100 mg  100 mg Intramuscular Once Cobos, Fernando A, MD      . thiamine (VITAMIN B-1) tablet 100 mg  100 mg Oral Daily Cobos, Myer Peer, MD   100 mg at 01/25/19 0828  . traZODone (DESYREL) tablet 50 mg  50 mg Oral QHS PRN Cobos, Myer Peer, MD   50 mg at 01/24/19 2119   PTA Medications: Medications Prior to Admission  Medication Sig Dispense Refill Last Dose  . acetaminophen (TYLENOL) 325 MG tablet Take 650 mg by mouth every 6 (six) hours as needed for mild pain or headache.     . Multiple Vitamin (MULTIVITAMIN WITH MINERALS) TABS tablet Take 1 tablet by mouth daily.     Marland Kitchen omeprazole (PRILOSEC) 10 MG capsule Take 10 mg by mouth daily as needed (for  heartburn).     . fenofibrate 160 MG tablet Take 160 mg by mouth daily. with food     . lisinopril-hydrochlorothiazide (ZESTORETIC) 20-25 MG tablet Take 1 tablet by mouth daily.       Patient Stressors: Arts development officer issue Traumatic event  Patient Strengths: Ability for insight Average or above average intelligence Child psychotherapist Motivation for treatment/growth Physical Health Supportive family/friends  Treatment Modalities: Medication Management, Group therapy, Case management,  1 to 1 session with clinician, Psychoeducation, Recreational therapy.   Physician Treatment Plan for Primary Diagnosis: <principal problem not specified> Long Term Goal(s): Improvement in symptoms so as ready for discharge Improvement in symptoms so as ready for discharge   Short Term Goals: Ability to identify changes in lifestyle to reduce recurrence of condition will improve Ability to verbalize feelings will improve Ability to disclose and discuss suicidal ideas Ability to demonstrate self-control will improve Ability to identify and develop effective coping behaviors will improve  Medication Management: Evaluate patient's response, side effects, and tolerance of medication regimen.  Therapeutic Interventions: 1 to 1 sessions, Unit Group sessions and Medication administration.  Evaluation of Outcomes: Progressing  Physician Treatment Plan for Secondary Diagnosis: Active Problems:   Depression  Long Term Goal(s): Improvement in symptoms  so as ready for discharge Improvement in symptoms so as ready for discharge   Short Term Goals: Ability to identify changes in lifestyle to reduce recurrence of condition will improve Ability to verbalize feelings will improve Ability to disclose and discuss suicidal ideas Ability to demonstrate self-control will improve Ability to identify and develop effective coping behaviors will improve     Medication Management:  Evaluate patient's response, side effects, and tolerance of medication regimen.  Therapeutic Interventions: 1 to 1 sessions, Unit Group sessions and Medication administration.  Evaluation of Outcomes: Progressing   RN Treatment Plan for Primary Diagnosis: <principal problem not specified> Long Term Goal(s): Knowledge of disease and therapeutic regimen to maintain health will improve  Short Term Goals: Ability to participate in decision making will improve, Ability to verbalize feelings will improve, Ability to disclose and discuss suicidal ideas and Ability to identify and develop effective coping behaviors will improve  Medication Management: RN will administer medications as ordered by provider, will assess and evaluate patient's response and provide education to patient for prescribed medication. RN will report any adverse and/or side effects to prescribing provider.  Therapeutic Interventions: 1 on 1 counseling sessions, Psychoeducation, Medication administration, Evaluate responses to treatment, Monitor vital signs and CBGs as ordered, Perform/monitor CIWA, COWS, AIMS and Fall Risk screenings as ordered, Perform wound care treatments as ordered.  Evaluation of Outcomes: Progressing   LCSW Treatment Plan for Primary Diagnosis: <principal problem not specified> Long Term Goal(s): Safe transition to appropriate next level of care at discharge, Engage patient in therapeutic group addressing interpersonal concerns.  Short Term Goals: Engage patient in aftercare planning with referrals and resources  Therapeutic Interventions: Assess for all discharge needs, 1 to 1 time with Social worker, Explore available resources and support systems, Assess for adequacy in community support network, Educate family and significant other(s) on suicide prevention, Complete Psychosocial Assessment, Interpersonal group therapy.  Evaluation of Outcomes: Progressing   Progress in Treatment: Attending  groups: Yes. Participating in groups: Yes. and No. Taking medication as prescribed: Yes. Toleration medication: Yes. Family/Significant other contact made: Yes, individual(s) contacted:  the patient's wife Patient understands diagnosis: Yes. Discussing patient identified problems/goals with staff: Yes. Medical problems stabilized or resolved: Yes. Denies suicidal/homicidal ideation: Yes. Issues/concerns per patient self-inventory: No. Other:   New problem(s) identified: None   New Short Term/Long Term Goal(s): medication stabilization, elimination of SI thoughts, development of comprehensive mental wellness plan.    Patient Goals:    Discharge Plan or Barriers: Patient plans to discharge home with his wife. He has been referred to [facility] PHP for continuity of care for medication management and therapy services. CSW will continue to follow.    Reason for Continuation of Hospitalization: Depression Medication stabilization Suicidal ideation  Estimated Length of Stay: 1-2 days   Attendees: Patient: 01/25/2019 10:14 AM  Physician: Dr. Neita Garnet, MD; Dr. Myles Lipps, MD 01/25/2019 10:14 AM  Nursing: Chong Sicilian.D, RN; Estill Bamberg.Loletha Grayer, RN 01/25/2019 10:14 AM  RN Care Manager: 01/25/2019 10:14 AM  Social Worker: Radonna Ricker, [place] 01/25/2019 10:14 AM  Recreational Therapist:  01/25/2019 10:14 AM  Other:  01/25/2019 10:14 AM  Other:  01/25/2019 10:14 AM  Other: 01/25/2019 10:14 AM    Scribe for Treatment Team: Marylee Floras, [place] 01/25/2019 10:14 AM

## 2019-01-25 NOTE — BHH Group Notes (Signed)
Occupational Therapy Group Note  Date:  01/25/2019 Time:  11:00 AM  Group Topic/Focus:  Stress Management  Participation Level:  Active  Participation Quality:  Appropriate  Affect:  Depressed and Flat  Cognitive:  Appropriate  Insight: Improving  Engagement in Group:  Engaged  Modes of Intervention:  Activity, Discussion, Education and Socialization  Additional Comments:    S: "Music is helpful and going for a long drive"  O: Stress management group completed to use as productive coping strategy, to help mitigate maladaptive coping to integrate in functional BADL/IADL. Stress management tool worksheet discussed to educate on unhealthy vs healthy coping skills to manage stress to improve community integration. Coping strategies taught include: relaxation based- deep breathing, counting to 10, taking a 1 minute vacation, acceptance, stress balls, relaxation audio/video, visual/mental imagery. Positive mental attitude- gratitude, acceptance, cognitive reframing, positive self talk, anger management. Coping skills bingo played with education given on variety of coping skills between bingo calls. Pts encouraged to share experience with various coping skills and share what has worked for them with others. Coloring and relaxation guide handouts given at the end of the session.   A: Pt presents with flat affect, engaged and participatory throughout session. He shares how going for a long drive and music helps to relieve stress. He also shares that he suppresses his stress. He would like to implement some relaxation and PMA strategies.  P: OT group will be x1 per week while pt inpatient.   Zenovia Jarred, MSOT, OTR/L Behavioral Health OT/ Acute Relief OT PHP Office: [place] 01/25/2019, 11:00 AM

## 2019-01-25 NOTE — Progress Notes (Signed)
[facility] MD Progress Note  01/25/2019 11:28 AM Victor Boyer  MRN:  [id number] Subjective:  Patient is a 53 year old male who was admitted on 6/25 secondary to suicidal ideation.  The patient had been arrested the day prior to admission secondary to child sonography issues.  He had never suffered suicidal ideation previously.  There was great concern that he would kill himself.  He also had been drinking 6-8 beers per night.  Objective: Patient is seen and examined.  Patient is a 53 year old male with the above-stated past psychiatric history who is seen in follow-up.  He continues to slowly improve.  He stated that he was not feeling suicidal today at all.  He stated he was having a good day.  He denied any side effects to his current medications.  We discussed the plan for outpatient intensive program after discharge.  He denied any withdrawal symptoms.  He denied any side effects to his current medications.  His vital signs are stable, he is afebrile.  No new laboratories.  Principal Problem: <principal problem not specified> Diagnosis: Active Problems:   Depression  Total Time spent with patient: 20 minutes  Past Psychiatric History: See admission H&P  Past Medical History: History reviewed. No pertinent past medical history. History reviewed. No pertinent surgical history. Family History: History reviewed. No pertinent family history. Family Psychiatric  History: See admission H&P Social History:  Social History   Substance and Sexual Activity  Alcohol Use Yes   Comment: 24 pack/week     Social History   Substance and Sexual Activity  Drug Use Never    Social History   Socioeconomic History  . Marital status: Married    Spouse name: Not on file  . Number of children: Not on file  . Years of education: Not on file  . Highest education level: Not on file  Occupational History  . Not on file  Social Needs  . Financial resource strain: Not on file  . Food insecurity    Worry: Not  on file    Inability: Not on file  . Transportation needs    Medical: Not on file    Non-medical: Not on file  Tobacco Use  . Smoking status: Current Every Day Smoker    Packs/day: 1.00    Types: Cigarettes  . Smokeless tobacco: Never Used  Substance and Sexual Activity  . Alcohol use: Yes    Comment: 24 pack/week  . Drug use: Never  . Sexual activity: Yes    Birth control/protection: Coitus interruptus  Lifestyle  . Physical activity    Days per week: Not on file    Minutes per session: Not on file  . Stress: Not on file  Relationships  . Social Herbalist on phone: Not on file    Gets together: Not on file    Attends religious service: Not on file    Active member of club or organization: Not on file    Attends meetings of clubs or organizations: Not on file    Relationship status: Not on file  Other Topics Concern  . Not on file  Social History Narrative  . Not on file   Additional Social History:    Pain Medications: see MAR Prescriptions: see MAR Over the Counter: see MAR History of alcohol / drug use?: No history of alcohol / drug abuse                    Sleep: Good  Appetite:  Fair  Current Medications: Current Facility-Administered Medications  Medication Dose Route Frequency Provider Last Rate Last Dose  . acetaminophen (TYLENOL) tablet 650 mg  650 mg Oral Q6H PRN Mordecai Maes, NP      . alum & mag hydroxide-simeth (MAALOX/MYLANTA) 200-200-20 MG/5ML suspension 30 mL  30 mL Oral Q4H PRN Mordecai Maes, NP      . citalopram (CELEXA) tablet 20 mg  20 mg Oral Daily Cobos, Myer Peer, MD   20 mg at 01/25/19 8938  . fenofibrate tablet 160 mg  160 mg Oral Daily Lindon Romp A, NP   160 mg at 01/25/19 1017  . lisinopril (ZESTRIL) tablet 20 mg  20 mg Oral Daily Hampton Abbot, MD   20 mg at 01/25/19 5102   And  . hydrochlorothiazide (HYDRODIURIL) tablet 25 mg  25 mg Oral Daily Hampton Abbot, MD   25 mg at 01/25/19 5852  . multivitamin  with minerals tablet 1 tablet  1 tablet Oral Daily Cobos, Myer Peer, MD   1 tablet at 01/25/19 [phone]  . nicotine (NICODERM CQ - dosed in mg/24 hours) patch 21 mg  21 mg Transdermal Daily Cobos, Myer Peer, MD   21 mg at 01/25/19 0827  . thiamine (B-1) injection 100 mg  100 mg Intramuscular Once Cobos, Fernando A, MD      . thiamine (VITAMIN B-1) tablet 100 mg  100 mg Oral Daily Cobos, Myer Peer, MD   100 mg at 01/25/19 0828  . traZODone (DESYREL) tablet 50 mg  50 mg Oral QHS PRN Cobos, Myer Peer, MD   50 mg at 01/24/19 2119    Lab Results: No results found for this or any previous visit (from the past 48 hour(s)).  Blood Alcohol level:  No results found for: [facility]  Metabolic Disorder Labs: No results found for: HGBA1C, MPG No results found for: PROLACTIN Lab Results  Component Value Date   CHOL 203 (H) 01/21/2019   TRIG 135 01/21/2019   HDL 48 01/21/2019   CHOLHDL 4.2 01/21/2019   VLDL 27 01/21/2019   LDLCALC 128 (H) 01/21/2019    Physical Findings: AIMS: Facial and Oral Movements Muscles of Facial Expression: None, normal Lips and Perioral Area: None, normal Jaw: None, normal Tongue: None, normal,Extremity Movements Upper (arms, wrists, hands, fingers): None, normal Lower (legs, knees, ankles, toes): None, normal, Trunk Movements Neck, shoulders, hips: None, normal, Overall Severity Severity of abnormal movements (highest score from questions above): None, normal Incapacitation due to abnormal movements: None, normal Patient's awareness of abnormal movements (rate only patient's report): No Awareness, Dental Status Current problems with teeth and/or dentures?: No Does patient usually wear dentures?: No  CIWA:  CIWA-Ar Total: 1 COWS:  COWS Total Score: 1  Musculoskeletal: Strength & Muscle Tone: within normal limits Gait & Station: normal Patient leans: N/A  Psychiatric Specialty Exam: Physical Exam  Nursing note and vitals reviewed. Constitutional: He is oriented to  person, place, and time. He appears well-developed and well-nourished.  HENT:  Head: Normocephalic and atraumatic.  Respiratory: Effort normal.  Neurological: He is alert and oriented to person, place, and time.    ROS  Blood pressure 137/87, pulse 77, temperature 97.8 F (36.6 C), temperature source Oral, resp. rate 18, height 5\' 10"  (1.778 m), weight 90.7 kg, SpO2 97 %.Body mass index is 28.7 kg/m.  General Appearance: Casual  Eye Contact:  Good  Speech:  Normal Rate  Volume:  Normal  Mood:  Anxious  Affect:  Congruent  Thought Process:  Coherent and Descriptions of Associations: Intact  Orientation:  Full (Time, Place, and Person)  Thought Content:  Logical  Suicidal Thoughts:  No  Homicidal Thoughts:  No  Memory:  Immediate;   Fair Recent;   Fair Remote;   Fair  Judgement:  Intact  Insight:  Fair  Psychomotor Activity:  Normal  Concentration:  Concentration: Fair and Attention Span: Fair  Recall:  [employer] of Knowledge:  Fair  Language:  Fair  Akathisia:  Negative  Handed:  Right  AIMS (if indicated):     Assets:  Desire for Improvement Housing Resilience Social Support  ADL's:  Intact  Cognition:  WNL  Sleep:  Number of Hours: 6.75     Treatment Plan Summary: Daily contact with patient to assess and evaluate symptoms and progress in treatment, Medication management and Plan : Patient is seen and examined.  Patient is a 53 year old male with the above-stated past psychiatric history who is seen in follow-up.   Diagnosis: #1 major depression, recurrent, severe without psychotic features, #2 pornography addiction.  #3 hypertension, #4 hyperlipidemia, #5 alcohol abuse  Patient is seen in follow-up.  His mood continues to slowly improve.  His citalopram was increased to 20 mg a day yesterday.  He denied any suicidal ideation.  He feels like his depression is lifting.  He has been encouraged over the fact that his wife is going to let him come back home.  We  discussed the plan for outpatient treatment after discharge.  If everything continues to go well we will plan on discharge tomorrow. 1.  Continue citalopram 20 mg p.o. daily for mood and anxiety. 2.  Continue fenofibrate 160 mg p.o. daily for hyperlipidemia. 3.  Continue lisinopril 20 mg p.o. daily for hypertension. 4.  Continue hydrochlorothiazide 25 mg p.o. daily for hypertension. 5.  Continue thiamine 100 mg p.o. daily for nutritional supplementation. 6.  Continue trazodone 50 mg p.o. nightly as needed insomnia. 7.  Disposition planning-in progress.  Sharma Covert, MD 01/25/2019, 11:28 AM

## 2019-01-25 NOTE — Progress Notes (Signed)
DAR NOTE: Pt present with flat affect and depressed mood in the unit.. Pt denies physical pain, took  his meds as scheduled. Pt stated he was arrested on Monday and fear that he might go to jail. Pt interested in long term txt. Pt's safety ensured with 15 minute and environmental checks. Pt currently denies SI/HI and A/V hallucinations. Pt verbally agrees to seek staff if SI/HI or A/VH occurs and to consult with staff before acting on these thoughts. Will continue POC.

## 2019-01-25 NOTE — Progress Notes (Signed)
[place] NOVEL CORONAVIRUS (COVID-19) DAILY CHECK-OFF SYMPTOMS - answer yes or no to each - every day NO YES  Have you had a fever in the past 24 hours?  . Fever (Temp > 37.80C / 100F) X   Have you had any of these symptoms in the past 24 hours? . New Cough .  Sore Throat  .  Shortness of Breath .  Difficulty Breathing .  Unexplained Body Aches   X   Have you had any one of these symptoms in the past 24 hours not related to allergies?   . Runny Nose .  Nasal Congestion .  Sneezing   X   If you have had runny nose, nasal congestion, sneezing in the past 24 hours, has it worsened?  X   EXPOSURES - check yes or no X   Have you traveled outside the state in the past 14 days?  X   Have you been in contact with someone with a confirmed diagnosis of COVID-19 or PUI in the past 14 days without wearing appropriate PPE?  X   Have you been living in the same home as a person with confirmed diagnosis of COVID-19 or a PUI (household contact)?    X   Have you been diagnosed with COVID-19?    X              What to do next: Answered NO to all: Answered YES to anything:   Proceed with unit schedule Follow the [facility] Inpatient Flowsheet.   

## 2019-01-25 NOTE — Progress Notes (Signed)
Patient ID: Juwaun Inskeep, male   DOB: 10-Aug-1965, 53 y.o.   MRN: [id number]  Nursing Progress Note [id number]  Patient presents anxious and sad but is pleasant on approach. Patient compliant with scheduled medications. Patient is seen attending groups and visible in the milieu. Patient currently denies SI/HI/AVH.   Patient is educated about and provided medication per provider's orders. Patient safety maintained with q15 min safety checks and low fall risk precautions. Emotional support given, 1:1 interaction, and active listening provided. Patient encouraged to attend meals, groups, and work on treatment plan and goals. Labs, vital signs and patient behavior monitored throughout shift. Patient encouraged to wear mask when in the milieu and is educated about coronavirus infection control precautions.  Patient refusing to wear mask on the unit but is compliant to wear mask off unit. Patient contracts for safety with staff. Patient remains safe on the unit at this time and agrees to come to staff with any issues/concerns. Patient is interacting with peers appropriately on the unit. Will continue to support and monitor.

## 2019-01-26 MED ORDER — CITALOPRAM HYDROBROMIDE 20 MG PO TABS: 20.0000 mg | ORAL_TABLET | Freq: Every day | ORAL | 0 refills | Status: AC

## 2019-01-26 MED ORDER — TRAZODONE HCL 50 MG PO TABS: 50.0000 mg | ORAL_TABLET | Freq: Every evening | ORAL | 0 refills | Status: AC | PRN

## 2019-01-26 MED ORDER — NICOTINE 21 MG/24HR TD PT24: 21.0000 mg | MEDICATED_PATCH | Freq: Every day | TRANSDERMAL | 0 refills | Status: DC

## 2019-01-26 NOTE — Progress Notes (Signed)
  [facility] Adult Case Management Discharge Plan :  Will you be returning to the same living situation after discharge:  Yes,  patient is returning home with his wife At discharge, do you have transportation home?: Yes,  patient's wife is picking him up  Do you have the ability to pay for your medications: Yes,  [employer], income from employment  Release of information consent forms completed and in the chart;  Patient's signature needed at discharge.  Patient to Follow up at: Follow-up Information    [facility] Follow up on 01/31/2019.   Why: Hospital follow up appointment is Tuesday, 7/7 at 11:15a. Please bring current medications and discharge paperwork from this hospitalization.  Contact information: [place], [place] [address] Ph: [phone] Fx: [phone]       [facility] Follow up on 01/30/2019.   Specialty: Behavioral Health Why: Your CCA appointment is Monday, 7/6 at 10:00a. Appointments will be done over WebEx and a link will be sent to your email.  Contact information: [place] [id number] [place] [phone]          Next level of care provider has access to [place] and Suicide Prevention discussed: Yes,  with the patient's wife     Has patient been referred to the Quitline?: N/A patient is not a smoker  Patient has been referred for addiction treatment: N/A  Marylee Floras, [place] 01/26/2019, 9:45 AM

## 2019-01-26 NOTE — [facility] Suicide Risk Assessment (Signed)
[facility] Discharge Suicide Risk Assessment   Principal Problem: <principal problem not specified> Discharge Diagnoses: Active Problems:   Depression   Major depressive disorder, single episode, severe (HCC)   Alcohol abuse   Pornography addiction   Total Time spent with patient: 15 minutes  Musculoskeletal: Strength & Muscle Tone: within normal limits Gait & Station: normal Patient leans: N/A  Psychiatric Specialty Exam: Review of Systems  All other systems reviewed and are negative.   Blood pressure 126/84, pulse 79, temperature 97.8 F (36.6 C), temperature source Oral, resp. rate 16, height 5\' 10"  (1.778 m), weight 90.7 kg, SpO2 97 %.Body mass index is 28.7 kg/m.  General Appearance: Casual  Eye Contact::  Good  Speech:  Normal Rate409  Volume:  Normal  Mood:  Euthymic  Affect:  Congruent  Thought Process:  Coherent and Descriptions of Associations: Intact  Orientation:  Full (Time, Place, and Person)  Thought Content:  Logical  Suicidal Thoughts:  No  Homicidal Thoughts:  No  Memory:  Immediate;   Good Recent;   NA Remote;   Good  Judgement:  Intact  Insight:  Fair  Psychomotor Activity:  Normal  Concentration:  Fair  Recall:  Good  Fund of Knowledge:Good  Language: Good  Akathisia:  Negative  Handed:  Right  AIMS (if indicated):     Assets:  Desire for Improvement Housing Resilience Social Support  Sleep:  Number of Hours: 6.75  Cognition: WNL  ADL's:  Intact   Mental Status Per Nursing Assessment::   On Admission:  Suicidal ideation indicated by patient, Suicidal ideation indicated by others, Self-harm thoughts, Belief that plan would result in death, Intention to act on plan to harm others, Suicide plan, Self-harm behaviors  Demographic Factors:  Male and Caucasian  Loss Factors: Legal issues  Historical Factors: Impulsivity  Risk Reduction Factors:   Sense of responsibility to family, Living with another person, especially a relative and  Positive social support  Continued Clinical Symptoms:  Depression:   Impulsivity  Cognitive Features That Contribute To Risk:  None    Suicide Risk:  Minimal: No identifiable suicidal ideation.  Patients presenting with no risk factors but with morbid ruminations; may be classified as minimal risk based on the severity of the depressive symptoms  Follow-up Information    [facility] Follow up on 01/31/2019.   Why: Hospital follow up appointment is Tuesday, 7/7 at 11:15a. Please bring current medications and discharge paperwork from this hospitalization.  Contact information: [place], [place] [address] Ph: [phone] Fx: [phone]       [facility] Follow up on 01/30/2019.   Specialty: Behavioral Health Why: Your CCA appointment is Monday, 7/6 at 10:00a. Appointments will be done over WebEx and a link will be sent to your email.  Contact information: [place] [id number] [place] [phone]          Plan Of Care/Follow-up recommendations:  Activity:  ad lib  Sharma Covert, MD 01/26/2019, 7:39 AM

## 2019-01-26 NOTE — Plan of Care (Signed)
Nurse discussed anxiety, depression and coping skills with patient.  

## 2019-01-26 NOTE — Progress Notes (Signed)
Discharge Note:  Patient discharged with family member.  Patient denied SI and HI.  Denied A/V hallucinations.  Suicide prevention information given and discussed with patient who stated he understood and had no questions.  Patient stated he received all his belongings, clothing, toiletries, misc items.  Patient stated he appreciated all assistance received from [facility] staff.  All required discharge information given to patient at discharge.
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
## 2019-01-26 NOTE — Progress Notes (Signed)
D:  Patient denied SI and HI, contracts for safety.  Denied A/V hallucinations.  Denied pain. A:  Medications administered per MD orders.  Emotional support and encouragement given patient. R:  Safety maintained with 15 minute checks.  

## 2019-01-30 ENCOUNTER — Ambulatory Visit (HOSPITAL_COMMUNITY): Payer: Commercial Managed Care - PPO

## 2019-02-01 ENCOUNTER — Telehealth (HOSPITAL_COMMUNITY): Payer: Self-pay | Admitting: Professional

## 2019-02-01 ENCOUNTER — Ambulatory Visit (HOSPITAL_COMMUNITY): Payer: Commercial Managed Care - PPO | Admitting: Psychiatry

## 2019-02-01 ENCOUNTER — Other Ambulatory Visit (HOSPITAL_COMMUNITY): Payer: Commercial Managed Care - PPO | Attending: Family | Admitting: Licensed Clinical Social Worker

## 2019-02-01 ENCOUNTER — Other Ambulatory Visit: Payer: Self-pay

## 2019-02-01 DIAGNOSIS — R4582 Worries: Secondary | ICD-10-CM | POA: Diagnosis not present

## 2019-02-01 DIAGNOSIS — F322 Major depressive disorder, single episode, severe without psychotic features: Secondary | ICD-10-CM | POA: Insufficient documentation

## 2019-02-01 DIAGNOSIS — R4584 Anhedonia: Secondary | ICD-10-CM | POA: Insufficient documentation

## 2019-02-01 DIAGNOSIS — R413 Other amnesia: Secondary | ICD-10-CM | POA: Insufficient documentation

## 2019-02-01 DIAGNOSIS — R630 Anorexia: Secondary | ICD-10-CM | POA: Diagnosis not present

## 2019-02-01 DIAGNOSIS — R45851 Suicidal ideations: Secondary | ICD-10-CM | POA: Diagnosis not present

## 2019-02-01 DIAGNOSIS — F1010 Alcohol abuse, uncomplicated: Secondary | ICD-10-CM | POA: Diagnosis not present

## 2019-02-01 DIAGNOSIS — G4709 Other insomnia: Secondary | ICD-10-CM | POA: Diagnosis not present

## 2019-02-01 DIAGNOSIS — F419 Anxiety disorder, unspecified: Secondary | ICD-10-CM | POA: Diagnosis not present

## 2019-02-01 NOTE — Telephone Encounter (Signed)
Cln called pt to f/u on referrals since pt decided to do individual therapy and not PHP. Cln provided following referrals: Posey Rea [phone] Marlis Edelson [phone] Barbarann Ehlers [phone] Dessie Coma [phone]  Pt reports he will call to set up appts. Pt shares he will continue to see PCP for meds. Cln encouraged pt to call back if any questions/concerns arise. Pt agreed. Pt again denied SI/HI/AVH.

## 2019-02-02 ENCOUNTER — Ambulatory Visit (HOSPITAL_COMMUNITY): Payer: Commercial Managed Care - PPO | Admitting: Licensed Clinical Social Worker
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
## 2019-03-26 ENCOUNTER — Encounter (HOSPITAL_COMMUNITY): Payer: Self-pay | Admitting: *Deleted

## 2019-03-26 ENCOUNTER — Other Ambulatory Visit: Payer: Self-pay

## 2019-03-26 ENCOUNTER — Emergency Department (HOSPITAL_COMMUNITY): Payer: Commercial Managed Care - PPO

## 2019-03-26 ENCOUNTER — Inpatient Hospital Stay (HOSPITAL_COMMUNITY)
Admission: EM | Admit: 2019-03-26 | Discharge: 2019-04-02 | DRG: 871 | Disposition: A | Discharge status: Home Health | Payer: Commercial Managed Care - PPO | Attending: Internal Medicine | Admitting: Internal Medicine

## 2019-03-26 DIAGNOSIS — Z6827 Body mass index (BMI) 27.0-27.9, adult: Secondary | ICD-10-CM

## 2019-03-26 DIAGNOSIS — Z79899 Other long term (current) drug therapy: Secondary | ICD-10-CM | POA: Diagnosis not present

## 2019-03-26 DIAGNOSIS — Z9049 Acquired absence of other specified parts of digestive tract: Secondary | ICD-10-CM

## 2019-03-26 DIAGNOSIS — K5792 Diverticulitis of intestine, part unspecified, without perforation or abscess without bleeding: Secondary | ICD-10-CM | POA: Diagnosis present

## 2019-03-26 DIAGNOSIS — R109 Unspecified abdominal pain: Secondary | ICD-10-CM | POA: Diagnosis present

## 2019-03-26 DIAGNOSIS — Z20828 Contact with and (suspected) exposure to other viral communicable diseases: Secondary | ICD-10-CM | POA: Diagnosis present

## 2019-03-26 DIAGNOSIS — K651 Peritoneal abscess: Secondary | ICD-10-CM | POA: Diagnosis present

## 2019-03-26 DIAGNOSIS — E785 Hyperlipidemia, unspecified: Secondary | ICD-10-CM | POA: Diagnosis present

## 2019-03-26 DIAGNOSIS — E7800 Pure hypercholesterolemia, unspecified: Secondary | ICD-10-CM | POA: Diagnosis present

## 2019-03-26 DIAGNOSIS — I10 Essential (primary) hypertension: Secondary | ICD-10-CM | POA: Diagnosis present

## 2019-03-26 DIAGNOSIS — K219 Gastro-esophageal reflux disease without esophagitis: Secondary | ICD-10-CM | POA: Diagnosis present

## 2019-03-26 DIAGNOSIS — F418 Other specified anxiety disorders: Secondary | ICD-10-CM | POA: Diagnosis present

## 2019-03-26 DIAGNOSIS — E441 Mild protein-calorie malnutrition: Secondary | ICD-10-CM | POA: Diagnosis present

## 2019-03-26 DIAGNOSIS — R6510 Systemic inflammatory response syndrome (SIRS) of non-infectious origin without acute organ dysfunction: Secondary | ICD-10-CM | POA: Diagnosis present

## 2019-03-26 DIAGNOSIS — R0902 Hypoxemia: Secondary | ICD-10-CM | POA: Diagnosis present

## 2019-03-26 DIAGNOSIS — F322 Major depressive disorder, single episode, severe without psychotic features: Secondary | ICD-10-CM | POA: Diagnosis present

## 2019-03-26 DIAGNOSIS — Z8042 Family history of malignant neoplasm of prostate: Secondary | ICD-10-CM

## 2019-03-26 DIAGNOSIS — E876 Hypokalemia: Secondary | ICD-10-CM | POA: Diagnosis present

## 2019-03-26 DIAGNOSIS — K5720 Diverticulitis of large intestine with perforation and abscess without bleeding: Secondary | ICD-10-CM | POA: Diagnosis present

## 2019-03-26 DIAGNOSIS — F17210 Nicotine dependence, cigarettes, uncomplicated: Secondary | ICD-10-CM | POA: Diagnosis present

## 2019-03-26 DIAGNOSIS — A419 Sepsis, unspecified organism: Secondary | ICD-10-CM | POA: Diagnosis present

## 2019-03-26 DIAGNOSIS — E871 Hypo-osmolality and hyponatremia: Secondary | ICD-10-CM | POA: Diagnosis present

## 2019-03-26 HISTORY — DX: Essential (primary) hypertension: I10

## 2019-03-26 HISTORY — DX: Pure hypercholesterolemia, unspecified: E78.00

## 2019-03-26 HISTORY — DX: Depression, unspecified: F32.A

## 2019-03-26 HISTORY — DX: Anxiety disorder, unspecified: F41.9

## 2019-03-26 LAB — URINALYSIS, ROUTINE W REFLEX MICROSCOPIC
Bacteria, UA: NONE SEEN
Bilirubin Urine: NEGATIVE
Glucose, UA: NEGATIVE mg/dL
Ketones, ur: NEGATIVE mg/dL
Leukocytes,Ua: NEGATIVE
Nitrite: NEGATIVE
Protein, ur: NEGATIVE mg/dL
Specific Gravity, Urine: >1.046 — ABNORMAL HIGH (ref 1.005–1.030)
pH: 6 (ref 5.0–8.0)

## 2019-03-26 LAB — COMPREHENSIVE METABOLIC PANEL
ALT: 13 U/L (ref 0–44)
AST: 12 U/L — ABNORMAL LOW (ref 15–41)
Albumin: 3.3 g/dL — ABNORMAL LOW (ref 3.5–5.0)
Alkaline Phosphatase: 57 U/L (ref 38–126)
Anion gap: 12 (ref 5–15)
BUN: 14 mg/dL (ref 6–20)
CO2: 23 mmol/L (ref 22–32)
Calcium: 8.6 mg/dL — ABNORMAL LOW (ref 8.9–10.3)
Chloride: 93 mmol/L — ABNORMAL LOW (ref 98–111)
Creatinine, Ser: 0.9 mg/dL (ref 0.61–1.24)
GFR calc Af Amer: >60 mL/min (ref >60)
GFR calc non Af Amer: >60 mL/min (ref >60)
Glucose, Bld: 108 mg/dL — ABNORMAL HIGH (ref 70–99)
Potassium: 3.3 mmol/L — ABNORMAL LOW (ref 3.5–5.1)
Sodium: 128 mmol/L — ABNORMAL LOW (ref 135–145)
Total Bilirubin: 1 mg/dL (ref 0.3–1.2)
Total Protein: 6.6 g/dL (ref 6.5–8.1)

## 2019-03-26 LAB — CBC
HCT: 43.5 % (ref 39.0–52.0)
Hemoglobin: 15 g/dL (ref 13.0–17.0)
MCH: 31.4 pg (ref 26.0–34.0)
MCHC: 34.5 g/dL (ref 30.0–36.0)
MCV: 91 fL (ref 80.0–100.0)
Platelets: 302 10*3/uL (ref 150–400)
RBC: 4.78 MIL/uL (ref 4.22–5.81)
RDW: 14.6 % (ref 11.5–15.5)
WBC: 20.9 10*3/uL — ABNORMAL HIGH (ref 4.0–10.5)
nRBC: 0 % (ref 0.0–0.2)

## 2019-03-26 LAB — LACTIC ACID, PLASMA: Lactic Acid, Venous: 0.9 mmol/L (ref 0.5–1.9)

## 2019-03-26 LAB — LIPASE, BLOOD: Lipase: 21 U/L (ref 11–51)

## 2019-03-26 LAB — SARS CORONAVIRUS 2 BY RT PCR (HOSPITAL ORDER, PERFORMED IN ~~LOC~~ HOSPITAL LAB): SARS Coronavirus 2: NEGATIVE

## 2019-03-26 MED ORDER — HYDRALAZINE HCL 20 MG/ML IJ SOLN: 10.0000 mg | INTRAMUSCULAR | Status: DC | PRN

## 2019-03-26 MED ORDER — ACETAMINOPHEN 650 MG RE SUPP: 650.0000 mg | Freq: Four times a day (QID) | RECTAL | Status: DC | PRN

## 2019-03-26 MED ORDER — METRONIDAZOLE IN NACL 5-0.79 MG/ML-% IV SOLN
500.0000 mg | Freq: Once | INTRAVENOUS | Status: AC
  Administered 2019-03-26: 500 mg via INTRAVENOUS
  Filled 2019-03-26: qty 100

## 2019-03-26 MED ORDER — VANCOMYCIN HCL 10 G IV SOLR
1250.0000 mg | Freq: Two times a day (BID) | INTRAVENOUS | Status: DC
  Filled 2019-03-26 (×2): qty 1250

## 2019-03-26 MED ORDER — ONDANSETRON HCL 4 MG PO TABS: 4.0000 mg | ORAL_TABLET | Freq: Four times a day (QID) | ORAL | Status: DC | PRN

## 2019-03-26 MED ORDER — ACETAMINOPHEN 325 MG PO TABS
650.0000 mg | ORAL_TABLET | Freq: Four times a day (QID) | ORAL | Status: DC | PRN
  Administered 2019-03-27 – 2019-03-28 (×3): 650 mg via ORAL
  Filled 2019-03-26 (×3): qty 2

## 2019-03-26 MED ORDER — VANCOMYCIN HCL IN DEXTROSE 1-5 GM/200ML-% IV SOLN: 1000.0000 mg | Freq: Once | INTRAVENOUS | Status: DC

## 2019-03-26 MED ORDER — VANCOMYCIN HCL 10 G IV SOLR
2000.0000 mg | Freq: Once | INTRAVENOUS | Status: AC
  Administered 2019-03-26: 2000 mg via INTRAVENOUS
  Filled 2019-03-26: qty 2000

## 2019-03-26 MED ORDER — ACETAMINOPHEN 325 MG PO TABS
650.0000 mg | ORAL_TABLET | Freq: Once | ORAL | Status: AC
  Administered 2019-03-26: 650 mg via ORAL
  Filled 2019-03-26: qty 2

## 2019-03-26 MED ORDER — SODIUM CHLORIDE 0.9 % IV BOLUS
1000.0000 mL | Freq: Once | INTRAVENOUS | Status: AC
  Administered 2019-03-26: 1000 mL via INTRAVENOUS

## 2019-03-26 MED ORDER — SODIUM CHLORIDE 0.9 % IV SOLN
2.0000 g | Freq: Once | INTRAVENOUS | Status: AC
  Administered 2019-03-26: 2 g via INTRAVENOUS
  Filled 2019-03-26: qty 2

## 2019-03-26 MED ORDER — SODIUM CHLORIDE 0.9 % IV SOLN
2.0000 g | Freq: Three times a day (TID) | INTRAVENOUS | Status: DC
  Administered 2019-03-27: 2 g via INTRAVENOUS
  Filled 2019-03-26 (×3): qty 2

## 2019-03-26 MED ORDER — ONDANSETRON HCL 4 MG/2ML IJ SOLN: 4.0000 mg | Freq: Four times a day (QID) | INTRAMUSCULAR | Status: DC | PRN

## 2019-03-26 MED ORDER — METRONIDAZOLE IN NACL 5-0.79 MG/ML-% IV SOLN
500.0000 mg | Freq: Three times a day (TID) | INTRAVENOUS | Status: DC
  Administered 2019-03-27: 500 mg via INTRAVENOUS
  Filled 2019-03-26: qty 100

## 2019-03-26 MED ORDER — IOHEXOL 300 MG/ML  SOLN
100.0000 mL | Freq: Once | INTRAMUSCULAR | Status: AC | PRN
  Administered 2019-03-26: 100 mL via INTRAVENOUS

## 2019-03-26 MED ORDER — SODIUM CHLORIDE 0.9 % IV SOLN
INTRAVENOUS | Status: AC
  Administered 2019-03-27: via INTRAVENOUS

## 2019-03-26 MED ORDER — SODIUM CHLORIDE 0.9% FLUSH: 3.0000 mL | Freq: Once | INTRAVENOUS | Status: DC

## 2019-03-26 NOTE — ED Notes (Signed)
Patient transported to CT 

## 2019-03-26 NOTE — ED Notes (Signed)
Placed on 2lpm oxygen 

## 2019-03-26 NOTE — ED Provider Notes (Signed)
[facility] EMERGENCY DEPARTMENT Provider Note   CSN: [id number] Arrival date & time: 03/26/19  1758     History   Chief Complaint Chief Complaint  Patient presents with   Abdominal Pain    HPI Tahsin Carse is a 53 y.o. male.     54 y.o male with a PMH of HTN presents to the ED with a chief complaint of left-sided abdominal pain which began 2 days ago.  He describes a left lower quadrant crampy sensation which is severe in nature, states it has now radiated to the middle of his lower abdomen.  He reports this pain was somewhat alleviated with defecation, he does endorse diarrhea.  He did take laxatives today, states he had multiple episodes of diarrhea without any blood.  Patient also endorses chills, fevers which began last night, with a T-max of 99.9 while at home.  He also endorses nausea but has not had any episodes of vomiting.  Patient did arrive in the ED satting at 90% on room air, currently not on any supplemental oxygen while at home.  He currently works as a Administrator, stating he travels along [place] in [place].  He also smokes cigarettes with a baseline productive cough which has not worsened in the last few days.  He denies any chest pain, shortness of breath, vomiting. Of note, patient denies any sick contacts or exposures to COVID-19 virus.  He also reports a prior history of an appendectomy.   Abdominal Pain Associated symptoms: cough (at baseline per pt), diarrhea and nausea   Associated symptoms: no chest pain, no chills, no dysuria, no fever, no hematuria, no shortness of breath, no sore throat and no vomiting     Past Medical History:  Diagnosis Date   Anxiety    Depression    High cholesterol    Hypertension     Patient Active Problem List   Diagnosis Date Noted   Major depressive disorder, single episode, severe ([place])    Alcohol abuse    Pornography addiction    Depression 01/19/2019    Past  Surgical History:  Procedure Laterality Date   APPENDECTOMY          Home Medications    Prior to Admission medications   Medication Sig Start Date End Date Taking? Authorizing Provider  acetaminophen (TYLENOL) 500 MG tablet Take 500 mg by mouth every 6 (six) hours as needed.   Yes [provider]  busPIRone (BUSPAR) 5 MG tablet Take 5 mg by mouth 2 (two) times daily.   Yes [provider]  citalopram (CELEXA) 20 MG tablet Take 1 tablet (20 mg total) by mouth daily. 01/27/19  Yes Connye Burkitt, NP  fenofibrate 160 MG tablet Take 160 mg by mouth daily. with food 10/31/18  Yes [provider]  lisinopril-hydrochlorothiazide (ZESTORETIC) 20-25 MG tablet Take 1 tablet by mouth daily. 12/23/18  Yes [provider]  Multiple Vitamin (MULTIVITAMIN WITH MINERALS) TABS tablet Take 1 tablet by mouth daily.   Yes [provider]  traZODone (DESYREL) 50 MG tablet Take 1 tablet (50 mg total) by mouth at bedtime as needed for sleep. 01/26/19  Yes Connye Burkitt, NP    Family History No family history on file.  Social History Social History   Tobacco Use   Smoking status: Current Every Day Smoker    Packs/day: 1.00    Types: Cigarettes   Smokeless tobacco: Never Used  Substance Use Topics  Alcohol use: Yes    Comment: 24 pack/week   Drug use: Never     Allergies   Patient has no known allergies.   Review of Systems Review of Systems  Constitutional: Negative for chills and fever.  HENT: Negative for ear pain and sore throat.   Eyes: Negative for pain and visual disturbance.  Respiratory: Positive for cough (at baseline per pt). Negative for shortness of breath.   Cardiovascular: Negative for chest pain and palpitations.  Gastrointestinal: Positive for abdominal pain, diarrhea and nausea. Negative for blood in stool and vomiting.  Genitourinary: Negative for dysuria and hematuria.  Musculoskeletal: Negative for arthralgias and back  pain.  Skin: Negative for color change and rash.  Neurological: Negative for seizures, syncope, light-headedness and headaches.  All other systems reviewed and are negative.    Physical Exam Updated Vital Signs BP 111/76    Pulse 86    Temp 99.5 F (37.5 C) (Oral)    Resp (!) 24    Ht 5\' 10"  (1.778 m)    Wt 86.2 kg    SpO2 96%    BMI 27.26 kg/m   Physical Exam Vitals signs and nursing note reviewed.  Constitutional:      Appearance: He is well-developed.     Comments: Ill-appearing.  HENT:     Head: Normocephalic and atraumatic.  Eyes:     General: No scleral icterus.    Pupils: Pupils are equal, round, and reactive to light.  Neck:     Musculoskeletal: Normal range of motion.  Cardiovascular:     Rate and Rhythm: Tachycardia present.     Heart sounds: Normal heart sounds.  Pulmonary:     Effort: Pulmonary effort is normal.     Breath sounds: Normal breath sounds. No wheezing.  Chest:     Chest wall: No tenderness.  Abdominal:     General: Bowel sounds are normal. There is no distension.     Palpations: Abdomen is soft.     Tenderness: There is abdominal tenderness in the suprapubic area and left lower quadrant. There is guarding. There is no right CVA tenderness, left CVA tenderness or rebound. Negative signs include McBurney's sign.  Musculoskeletal:        General: No tenderness or deformity.  Skin:    General: Skin is warm and dry.  Neurological:     Mental Status: He is alert and oriented to person, place, and time.      ED Treatments / Results  Labs (all labs ordered are listed, but only abnormal results are displayed) Labs Reviewed  COMPREHENSIVE METABOLIC PANEL - Abnormal; Notable for the following components:      Result Value   Sodium 128 (*)    Potassium 3.3 (*)    Chloride 93 (*)    Glucose, Bld 108 (*)    Calcium 8.6 (*)    Albumin 3.3 (*)    AST 12 (*)    All other components within normal limits  CBC - Abnormal; Notable for the following  components:   WBC 20.9 (*)    All other components within normal limits  URINALYSIS, ROUTINE W REFLEX MICROSCOPIC - Abnormal; Notable for the following components:   Specific Gravity, Urine >1.046 (*)    Hgb urine dipstick SMALL (*)    All other components within normal limits  SARS CORONAVIRUS 2 (HOSPITAL ORDER, [facility])  CULTURE, BLOOD (ROUTINE X 2)  CULTURE, BLOOD (ROUTINE X 2)  URINE CULTURE  LIPASE, BLOOD  LACTIC ACID, PLASMA  APTT  PROTIME-INR    EKG EKG Interpretation  Date/Time:  Sunday March 26 2019 19:12:36 EDT Ventricular Rate:  93 PR Interval:    QRS Duration: 91 QT Interval:  345 QTC Calculation: 430 R Axis:   23 Text Interpretation:  Sinus rhythm Consider left atrial enlargement No STEMI  Confirmed by Nanda Quinton [id number]) on 03/26/2019 7:38:38 PM   Radiology Ct Abdomen Pelvis W Contrast  Result Date: 03/26/2019 CLINICAL DATA:  Abdominal pain with diverticulitis suspected. EXAM: CT ABDOMEN AND PELVIS WITH CONTRAST TECHNIQUE: Multidetector CT imaging of the abdomen and pelvis was performed using the standard protocol following bolus administration of intravenous contrast. CONTRAST:  165mL OMNIPAQUE IOHEXOL 300 MG/ML  SOLN COMPARISON:  None. FINDINGS: Lower chest: There is atelectasis at the lung bases.The heart size is normal. Hepatobiliary: There is decreased hepatic attenuation suggestive of hepatic steatosis. Normal gallbladder.There is no biliary ductal dilation. Pancreas: Normal contours without ductal dilatation. No peripancreatic fluid collection. Spleen: No splenic laceration or hematoma. Adrenals/Urinary Tract: --Adrenal glands: No adrenal hemorrhage. --Right kidney/ureter: No hydronephrosis or perinephric hematoma. --Left kidney/ureter: No hydronephrosis or perinephric hematoma. --Urinary bladder: There is some asymmetric bladder wall thickening which is presumably reactive. Stomach/Bowel: --Stomach/Duodenum: No hiatal hernia or  other gastric abnormality. Normal duodenal course and caliber. --Small bowel: No dilatation or inflammation. --Colon: There is sigmoid diverticulitis. There is an adjacent fluid collection measuring 4.1 x 2.1 cm. There are scattered colonic diverticula. --Appendix: Not visualized. No right lower quadrant inflammation or free fluid. Vascular/Lymphatic: Normal course and caliber of the major abdominal vessels. There is a circumaortic left renal vein, a normal variant. --No retroperitoneal lymphadenopathy. --No mesenteric lymphadenopathy. --No pelvic or inguinal lymphadenopathy. Reproductive: Unremarkable Other: There is a trace amount of extraluminal free air. There is reactive free fluid in the left pericolic gutter. There are bilateral fat containing inguinal hernias. Musculoskeletal. No acute displaced fractures. IMPRESSION: 1. Findings consistent with sigmoid diverticulitis. There is an adjacent 4.1 x 2.1 cm fluid collection which may represent a collection of reactive free fluid. A developing abscess is not excluded. There is a trace amount of extraluminal free air adjacent to this fluid collection, best visualized on the coronal view. 2. Hepatic steatosis. 3. Asymmetric bladder wall thickening, presumably reactive. Electronically Signed   By: Constance Holster M.D.   On: 03/26/2019 21:02   Dg Chest Port 1 View  Result Date: 03/26/2019 CLINICAL DATA:  Fever EXAM: PORTABLE CHEST 1 VIEW COMPARISON:  None. FINDINGS: This is a mildly low volume film. The cardiomediastinal silhouette is unremarkable. Peribronchial thickening noted. There is no evidence of focal airspace disease, pulmonary edema, suspicious pulmonary nodule/mass, pleural effusion, or pneumothorax. No acute bony abnormalities are identified. IMPRESSION: Peribronchial thickening without evidence of focal airspace disease. Electronically Signed   By: Margarette Canada M.D.   On: 03/26/2019 19:44    Procedures .Critical Care Performed by: Janeece Fitting,  PA-C Authorized by: Janeece Fitting, PA-C   Critical care provider statement:    Critical care time (minutes):  45   Critical care start time:  03/26/2019 6:45 PM   Critical care end time:  03/26/2019 7:30 PM   Critical care time was exclusive of:  Separately billable procedures and treating other patients   Critical care was necessary to treat or prevent imminent or life-threatening deterioration of the following conditions:  Sepsis   Critical care was time spent personally by me on the following activities:  Blood draw for specimens, development of treatment  plan with patient or surrogate, discussions with consultants, evaluation of patient's response to treatment, examination of patient, obtaining history from patient or surrogate, ordering and performing treatments and interventions, ordering and review of laboratory studies, ordering and review of radiographic studies, pulse oximetry, re-evaluation of patient's condition and review of old charts   (including critical care time)  Medications Ordered in ED Medications  sodium chloride flush (NS) 0.9 % injection 3 mL (3 mLs Intravenous Not Given 03/26/19 2002)  vancomycin (VANCOCIN) 2,000 mg in sodium chloride 0.9 % 500 mL IVPB (2,000 mg Intravenous New Bag/Given 03/26/19 1951)  ceFEPIme (MAXIPIME) 2 g in sodium chloride 0.9 % 100 mL IVPB (has no administration in time range)  vancomycin (VANCOCIN) 1,250 mg in sodium chloride 0.9 % 250 mL IVPB (has no administration in time range)  acetaminophen (TYLENOL) tablet 650 mg (650 mg Oral Given 03/26/19 1850)  sodium chloride 0.9 % bolus 1,000 mL (1,000 mLs Intravenous New Bag/Given 03/26/19 1920)  ceFEPIme (MAXIPIME) 2 g in sodium chloride 0.9 % 100 mL IVPB (2 g Intravenous New Bag/Given 03/26/19 1956)  metroNIDAZOLE (FLAGYL) IVPB 500 mg (0 mg Intravenous Stopped 03/26/19 2101)  iohexol (OMNIPAQUE) 300 MG/ML solution 100 mL (100 mLs Intravenous Contrast Given 03/26/19 2028)     Initial Impression /  Assessment and Plan / ED Course  I have reviewed the triage vital signs and the nursing notes.  Pertinent labs & imaging results that were available during my care of the patient were reviewed by me and considered in my medical decision making (see chart for details).       Patient with no prior past medical history presents to the ED with complaints of left lower quadrant pain which began 2 days ago, has also been febrile at home, has had multiple episodes of diarrhea did take some laxatives to help with his pain, reports defecation did make this pain improved however pain has become now persistent.  He arrived in the ED with a rectal temperature of 103.3, heart rate was 104, tachypneic along with satting at 90% on room air.  Code sepsis was activated, patient was given fluids, blood cultures were drawn, antibiotics of unknown source were not started.  Patient does currently work as a Geophysicist/field seismologist, states he has been to [place], [place], [place] in the past week.  Some suspicion for COVID-19 infection, will obtain swab at this time.  CBC was remarkable for a leukocytosis of 20.9, hemoglobin is within normal limits.  CMP showed slight hyponatremia, hypokalemia, hypochloremia suspect due to his infection, creatinine level is within normal limits.  LFTs are unremarkable.  He reports most of his pain is on the lower part of his abdomen specifically on the left side, family history of diverticulitis.  Will obtain CT abdomen to further evaluate patient's condition.  Lipase level is within normal limits, he is COVID negative. Lactic acid was normal.  Chest x-ray showed: Peribronchial thickening without evidence of focal airspace disease. CT of his abdomen and pelvis showed:  1. Findings consistent with sigmoid diverticulitis. There is an  adjacent 4.1 x 2.1 cm fluid collection which may represent a  collection of reactive free fluid. A developing abscess is not  excluded. There is a trace amount  of extraluminal free air adjacent  to this fluid collection, best visualized on the coronal view.  2. Hepatic steatosis.  3. Asymmetric bladder wall thickening, presumably reactive.    9:34 PM spoke to Dr. Redmond Pulling of general surgery who will evaluate patient while  in the ED.  Patient will likely need admission via medicine service.  Call placed for hospitalist admission.  9:47 PM spoke to Dr. Hal Hope from hospitalist service, he will admit patient along with further management of his diverticulitis.   Portions of this note were generated with [employer]. Dictation errors may occur despite best attempts at proofreading.  Final Clinical Impressions(s) / ED Diagnoses   Final diagnoses:  Diverticulitis  Sepsis without acute organ dysfunction, due to unspecified organism [facility])    ED Discharge Orders    None       Janeece Fitting, Hershal Coria 03/26/19 2147    Margette Fast, MD 03/27/19 1037

## 2019-03-26 NOTE — ED Notes (Signed)
Pt attempted to use urinal but not successful

## 2019-03-26 NOTE — ED Notes (Signed)
Report attempted 

## 2019-03-26 NOTE — H&P (Signed)
History and Physical    Victor Boyer [id number] DOB: 1965/09/27 DOA: 03/26/2019  PCP: Patient, No Pcp Per  Patient coming from: Home.  Chief Complaint: Abdominal pain.  HPI: Victor Boyer is a 53 y.o. male with history of hypertension hyperlipidemia depression anxiety was recently placed on BuSpar and trazodone 2 weeks ago started developing abdominal pain last 2 days.  Pain is mostly in the left lower quadrant which is gradually worsened in duration and severity.  Denies any nausea vomiting diarrhea denies any recent use of antibiotics.  Given the worsening pain patient came to the ER.  ED Course: In the ER patient was febrile with temperature 103 F.  Patient was briefly hypoxic for few minutes and chest x-ray was showing peribronchial thickening.  COVID-19 test was negative.  CT scan of the abdomen pelvis shows sigmoid diverticulitis with possible microperforation.  On-call general surgeon was consulted.  Patient started on empiric antibiotics.  Review of Systems: As per HPI, rest all negative.   Past Medical History:  Diagnosis Date   Anxiety    Depression    High cholesterol    Hypertension     Past Surgical History:  Procedure Laterality Date   APPENDECTOMY       reports that he has been smoking cigarettes. He has been smoking about 1.00 pack per day. He has never used smokeless tobacco. He reports current alcohol use. He reports that he does not use drugs.  No Known Allergies  Family History  Problem Relation Age of Onset   Prostate cancer Maternal Grandfather     Prior to Admission medications   Medication Sig Start Date End Date Taking? Authorizing Provider  acetaminophen (TYLENOL) 500 MG tablet Take 500 mg by mouth every 6 (six) hours as needed.   Yes [provider]  busPIRone (BUSPAR) 5 MG tablet Take 5 mg by mouth 2 (two) times daily.   Yes [provider]  citalopram (CELEXA) 20 MG tablet Take 1 tablet (20 mg total) by mouth daily.  01/27/19  Yes Connye Burkitt, NP  fenofibrate 160 MG tablet Take 160 mg by mouth daily. with food 10/31/18  Yes [provider]  lisinopril-hydrochlorothiazide (ZESTORETIC) 20-25 MG tablet Take 1 tablet by mouth daily. 12/23/18  Yes [provider]  Multiple Vitamin (MULTIVITAMIN WITH MINERALS) TABS tablet Take 1 tablet by mouth daily.   Yes [provider]  traZODone (DESYREL) 50 MG tablet Take 1 tablet (50 mg total) by mouth at bedtime as needed for sleep. 01/26/19  Yes Connye Burkitt, NP    Physical Exam: Constitutional: Moderately built and nourished. Vitals:   03/26/19 2000 03/26/19 2004 03/26/19 2015 03/26/19 2100  BP: 121/76  125/75 111/76  Pulse: 89  88 86  Resp: (!) 37  (!) 28 (!) 24  Temp:  99.5 F (37.5 C)    TempSrc:  Oral    SpO2: 96%  96% 96%  Weight:      Height:       Eyes: Anicteric no pallor. ENMT: No discharge from the ears eyes nose or mouth. Neck: No mass felt.  No neck rigidity. Respiratory: No rhonchi or crepitations. Cardiovascular: S1-S2 heard. Abdomen: Mild tenderness in left lower quadrant no guarding rigidity. Musculoskeletal: No edema. Skin: No rash. Neurologic: Alert awake oriented to time place and person.  Moves all extremities. Psychiatric: Is normal per normal affect.   Labs on Admission: I have personally reviewed following labs and imaging studies  CBC: Recent Labs  Lab  03/26/19 1829  WBC 20.9*  HGB 15.0  HCT 43.5  MCV 91.0  PLT 99991111   Basic Metabolic Panel: Recent Labs  Lab 03/26/19 1829  NA 128*  K 3.3*  CL 93*  CO2 23  GLUCOSE 108*  BUN 14  CREATININE 0.90  CALCIUM 8.6*   GFR: Estimated Creatinine Clearance: 100.3 mL/min (by C-G formula based on SCr of 0.9 mg/dL). Liver Function Tests: Recent Labs  Lab 03/26/19 1829  AST 12*  ALT 13  ALKPHOS 57  BILITOT 1.0  PROT 6.6  ALBUMIN 3.3*   Recent Labs  Lab 03/26/19 1829  LIPASE 21   No results for input(s): AMMONIA in the last 168  hours. Coagulation Profile: No results for input(s): INR, PROTIME in the last 168 hours. Cardiac Enzymes: No results for input(s): CKTOTAL, CKMB, CKMBINDEX, TROPONINI in the last 168 hours. BNP (last 3 results) No results for input(s): PROBNP in the last 8760 hours. HbA1C: No results for input(s): HGBA1C in the last 72 hours. CBG: No results for input(s): GLUCAP in the last 168 hours. Lipid Profile: No results for input(s): CHOL, HDL, LDLCALC, TRIG, CHOLHDL, LDLDIRECT in the last 72 hours. Thyroid Function Tests: No results for input(s): TSH, T4TOTAL, FREET4, T3FREE, THYROIDAB in the last 72 hours. Anemia Panel: No results for input(s): VITAMINB12, FOLATE, FERRITIN, TIBC, IRON, RETICCTPCT in the last 72 hours. Urine analysis:    Component Value Date/Time   COLORURINE YELLOW 03/26/2019 1857   APPEARANCEUR CLEAR 03/26/2019 1857   LABSPEC >1.046 (H) 03/26/2019 1857   PHURINE 6.0 03/26/2019 1857   GLUCOSEU NEGATIVE 03/26/2019 1857   HGBUR SMALL (A) 03/26/2019 1857   BILIRUBINUR NEGATIVE 03/26/2019 1857   KETONESUR NEGATIVE 03/26/2019 1857   PROTEINUR NEGATIVE 03/26/2019 1857   NITRITE NEGATIVE 03/26/2019 1857   LEUKOCYTESUR NEGATIVE 03/26/2019 1857   Sepsis Labs: @LABRCNTIP (procalcitonin:4,lacticidven:4) ) Recent Results (from the past 240 hour(s))  SARS Coronavirus 2 [facility] order, Performed in [facility] hospital lab) Nasopharyngeal Nasopharyngeal Swab     Status: None   Collection Time: 03/26/19  6:57 PM   Specimen: Nasopharyngeal Swab  Result Value Ref Range Status   SARS Coronavirus 2 NEGATIVE NEGATIVE Final    Comment: (NOTE) If result is NEGATIVE SARS-CoV-2 target nucleic acids are NOT DETECTED. The SARS-CoV-2 RNA is generally detectable in upper and lower  respiratory specimens during the acute phase of infection. The lowest  concentration of SARS-CoV-2 viral copies this assay can detect is 250  copies / mL. A negative result does not preclude SARS-CoV-2  infection  and should not be used as the sole basis for treatment or other  patient management decisions.  A negative result may occur with  improper specimen collection / handling, submission of specimen other  than nasopharyngeal swab, presence of viral mutation(s) within the  areas targeted by this assay, and inadequate number of viral copies  (<250 copies / mL). A negative result must be combined with clinical  observations, patient history, and epidemiological information. If result is POSITIVE SARS-CoV-2 target nucleic acids are DETECTED. The SARS-CoV-2 RNA is generally detectable in upper and lower  respiratory specimens dur ing the acute phase of infection.  Positive  results are indicative of active infection with SARS-CoV-2.  Clinical  correlation with patient history and other diagnostic information is  necessary to determine patient infection status.  Positive results do  not rule out bacterial infection or co-infection with other viruses. If result is PRESUMPTIVE POSTIVE SARS-CoV-2 nucleic acids MAY BE PRESENT.   A  presumptive positive result was obtained on the submitted specimen  and confirmed on repeat testing.  While 2019 novel coronavirus  (SARS-CoV-2) nucleic acids may be present in the submitted sample  additional confirmatory testing may be necessary for epidemiological  and / or clinical management purposes  to differentiate between  SARS-CoV-2 and other Sarbecovirus currently known to infect humans.  If clinically indicated additional testing with an alternate test  methodology [phone]) is advised. The SARS-CoV-2 RNA is generally  detectable in upper and lower respiratory sp ecimens during the acute  phase of infection. The expected result is Negative. Fact Sheet for Patients:  StrictlyIdeas.no Fact Sheet for Healthcare Providers: BankingDealers.co.za This test is not yet approved or cleared by the [place]  FDA and has been authorized for detection and/or diagnosis of SARS-CoV-2 by FDA under an Emergency Use Authorization (EUA).  This EUA will remain in effect (meaning this test can be used) for the duration of the COVID-19 declaration under Section 564(b)(1) of the Act, 21 U.S.C. section 360bbb-3(b)(1), unless the authorization is terminated or revoked sooner. Performed at [facility], [place] [address] [place], [place] [address]      Radiological Exams on Admission: Ct Abdomen Pelvis W Contrast  Result Date: 03/26/2019 CLINICAL DATA:  Abdominal pain with diverticulitis suspected. EXAM: CT ABDOMEN AND PELVIS WITH CONTRAST TECHNIQUE: Multidetector CT imaging of the abdomen and pelvis was performed using the standard protocol following bolus administration of intravenous contrast. CONTRAST:  1103mL OMNIPAQUE IOHEXOL 300 MG/ML  SOLN COMPARISON:  None. FINDINGS: Lower chest: There is atelectasis at the lung bases.The heart size is normal. Hepatobiliary: There is decreased hepatic attenuation suggestive of hepatic steatosis. Normal gallbladder.There is no biliary ductal dilation. Pancreas: Normal contours without ductal dilatation. No peripancreatic fluid collection. Spleen: No splenic laceration or hematoma. Adrenals/Urinary Tract: --Adrenal glands: No adrenal hemorrhage. --Right kidney/ureter: No hydronephrosis or perinephric hematoma. --Left kidney/ureter: No hydronephrosis or perinephric hematoma. --Urinary bladder: There is some asymmetric bladder wall thickening which is presumably reactive. Stomach/Bowel: --Stomach/Duodenum: No hiatal hernia or other gastric abnormality. Normal duodenal course and caliber. --Small bowel: No dilatation or inflammation. --Colon: There is sigmoid diverticulitis. There is an adjacent fluid collection measuring 4.1 x 2.1 cm. There are scattered colonic diverticula. --Appendix: Not visualized. No right lower quadrant inflammation or free fluid. Vascular/Lymphatic:  Normal course and caliber of the major abdominal vessels. There is a circumaortic left renal vein, a normal variant. --No retroperitoneal lymphadenopathy. --No mesenteric lymphadenopathy. --No pelvic or inguinal lymphadenopathy. Reproductive: Unremarkable Other: There is a trace amount of extraluminal free air. There is reactive free fluid in the left pericolic gutter. There are bilateral fat containing inguinal hernias. Musculoskeletal. No acute displaced fractures. IMPRESSION: 1. Findings consistent with sigmoid diverticulitis. There is an adjacent 4.1 x 2.1 cm fluid collection which may represent a collection of reactive free fluid. A developing abscess is not excluded. There is a trace amount of extraluminal free air adjacent to this fluid collection, best visualized on the coronal view. 2. Hepatic steatosis. 3. Asymmetric bladder wall thickening, presumably reactive. Electronically Signed   By: Constance Holster M.D.   On: 03/26/2019 21:02   Dg Chest Port 1 View  Result Date: 03/26/2019 CLINICAL DATA:  Fever EXAM: PORTABLE CHEST 1 VIEW COMPARISON:  None. FINDINGS: This is a mildly low volume film. The cardiomediastinal silhouette is unremarkable. Peribronchial thickening noted. There is no evidence of focal airspace disease, pulmonary edema, suspicious pulmonary nodule/mass, pleural effusion, or pneumothorax. No acute bony abnormalities are identified.  IMPRESSION: Peribronchial thickening without evidence of focal airspace disease. Electronically Signed   By: Margarette Canada M.D.   On: 03/26/2019 19:44     Assessment/Plan Principal Problem:   Acute diverticulitis Active Problems:   Major depressive disorder, single episode, severe (HCC)   SIRS (systemic inflammatory response syndrome) (HCC)   Essential hypertension    1. Sigmoid diverticulitis with microperforation and possible abscess -General surgery has been consulted patient will be kept n.p.o. except medication IV fluids pain relief  medication and antibiotics.  Further recommendation per general surgery. 2. Hypertension we will keep patient on PRN IV hydralazine continue lisinopril hold hydrochlorothiazide since patient is receiving fluids. 3. Hyperlipidemia on fenofibrate. 4. History of depression anxiety on Celexa BuSpar and trazodone.  GERD the patient has possible sigmoid diverticulitis with microperforation and abscess patient will need close monitoring for any further titration and will need inpatient status.   DVT prophylaxis: Lovenox and SCDs. Code Status: Full code. Family Communication: Patient's family at the bedside. Disposition Plan: Home. Consults called: General surgery. Admission status: Inpatient.   Rise Patience MD [facility] Pager [phone].  If 7PM-7AM, please contact night-coverage [email or web address] Password [email or web address]  03/26/2019, 10:21 PM

## 2019-03-26 NOTE — Consult Note (Signed)
Reason for Consult: Diverticulitis Referring Physician: Janeece Fitting PA-C  Victor Boyer is an 53 y.o. male.  HPI: 53 year old male with history of hypertension, depression developed abdominal pain on Friday which has progressively worsened prompting him to come to the emergency room for evaluation.  Initially his pain was intermittent on Friday as well as Saturday but after having a bowel movement late yesterday his pain is been rather constant.  It is most intense in the left lower abdomen.  It does not really radiate.  He tried a laxative earlier today and has had loose stool since.  No recent antibiotics.  Reports fevers and chills and nausea but no emesis.  No melena hematochezia.  No prior colonoscopy.  Family history only remarkable for prostate cancer.  He has had recent stress and is increase his tobacco usage.  He smokes about a pack and 1/2/day.  He has not had significant alcohol consumption since June.  He works as a Administrator.  In the emergency room he was febrile 103.  Initially had some low oxygen saturations around 90% on room air but increased.  He was found to have sigmoid diverticulitis with probable microperforation with an adjacent fluid collection.  We were asked to consult  Past Medical History:  Diagnosis Date  . Anxiety   . Depression   . High cholesterol   . Hypertension     Past Surgical History:  Procedure Laterality Date  . APPENDECTOMY      Family History  Problem Relation Age of Onset  . Prostate cancer Maternal Grandfather     Social History:  reports that he has been smoking cigarettes. He has been smoking about 1.00 pack per day. He has never used smokeless tobacco. He reports current alcohol use. He reports that he does not use drugs.  Allergies: No Known Allergies  Medications: I have reviewed the patient's current medications.  Results for orders placed or performed during the hospital encounter of 03/26/19 (from the past 48 hour(s))  Lipase,  blood     Status: None   Collection Time: 03/26/19  6:29 PM  Result Value Ref Range   Lipase 21 11 - 51 U/L    Comment: Performed at [facility], [place] [address], [place], [place] [address]  Comprehensive metabolic panel     Status: Abnormal   Collection Time: 03/26/19  6:29 PM  Result Value Ref Range   Sodium 128 (L) 135 - 145 mmol/L   Potassium 3.3 (L) 3.5 - 5.1 mmol/L   Chloride 93 (L) 98 - 111 mmol/L   CO2 23 22 - 32 mmol/L   Glucose, Bld 108 (H) 70 - 99 mg/dL   BUN 14 6 - 20 mg/dL   Creatinine, Ser 0.90 0.61 - 1.24 mg/dL   Calcium 8.6 (L) 8.9 - 10.3 mg/dL   Total Protein 6.6 6.5 - 8.1 g/dL   Albumin 3.3 (L) 3.5 - 5.0 g/dL   AST 12 (L) 15 - 41 U/L   ALT 13 0 - 44 U/L   Alkaline Phosphatase 57 38 - 126 U/L   Total Bilirubin 1.0 0.3 - 1.2 mg/dL   GFR calc non Af Amer >60 >60 mL/min   GFR calc Af Amer >60 >60 mL/min   Anion gap 12 5 - 15    Comment: Performed at [facility], [place] [address] [place], [place] [address]  CBC     Status: Abnormal   Collection Time: 03/26/19  6:29 PM  Result Value  Ref Range   WBC 20.9 (H) 4.0 - 10.5 K/uL   RBC 4.78 4.22 - 5.81 MIL/uL   Hemoglobin 15.0 13.0 - 17.0 g/dL   HCT 43.5 39.0 - 52.0 %   MCV 91.0 80.0 - 100.0 fL   MCH 31.4 26.0 - 34.0 pg   MCHC 34.5 30.0 - 36.0 g/dL   RDW 14.6 11.5 - 15.5 %   Platelets 302 150 - 400 K/uL   nRBC 0.0 0.0 - 0.2 %    Comment: Performed at [facility], [place] [address] [place], [place] [address]  Lactic acid, plasma     Status: None   Collection Time: 03/26/19  6:35 PM  Result Value Ref Range   Lactic Acid, Venous 0.9 0.5 - 1.9 mmol/L    Comment: Performed at [facility] [address] [place], [place] [address]  Urinalysis, Routine w reflex microscopic     Status: Abnormal   Collection Time: 03/26/19  6:57 PM  Result Value Ref Range   Color, Urine YELLOW YELLOW   APPearance CLEAR CLEAR   Specific Gravity, Urine >1.046 (H) 1.005 - 1.030   pH 6.0 5.0 - 8.0   Glucose, UA  NEGATIVE NEGATIVE mg/dL   Hgb urine dipstick SMALL (A) NEGATIVE   Bilirubin Urine NEGATIVE NEGATIVE   Ketones, ur NEGATIVE NEGATIVE mg/dL   Protein, ur NEGATIVE NEGATIVE mg/dL   Nitrite NEGATIVE NEGATIVE   Leukocytes,Ua NEGATIVE NEGATIVE   RBC / HPF 11-20 0 - 5 RBC/hpf   WBC, UA 0-5 0 - 5 WBC/hpf   Bacteria, UA NONE SEEN NONE SEEN   Mucus PRESENT     Comment: Performed at [facility] [address] [place],  [address]  SARS Coronavirus 2 [facility] order, Performed in [facility] hospital lab) Nasopharyngeal Nasopharyngeal Swab     Status: None   Collection Time: 03/26/19  6:57 PM   Specimen: Nasopharyngeal Swab  Result Value Ref Range   SARS Coronavirus 2 NEGATIVE NEGATIVE    Comment: (NOTE) If result is NEGATIVE SARS-CoV-2 target nucleic acids are NOT DETECTED. The SARS-CoV-2 RNA is generally detectable in upper and lower  respiratory specimens during the acute phase of infection. The lowest  concentration of SARS-CoV-2 viral copies this assay can detect is 250  copies / mL. A negative result does not preclude SARS-CoV-2 infection  and should not be used as the sole basis for treatment or other  patient management decisions.  A negative result may occur with  improper specimen collection / handling, submission of specimen other  than nasopharyngeal swab, presence of viral mutation(s) within the  areas targeted by this assay, and inadequate number of viral copies  (<250 copies / mL). A negative result must be combined with clinical  observations, patient history, and epidemiological information. If result is POSITIVE SARS-CoV-2 target nucleic acids are DETECTED. The SARS-CoV-2 RNA is generally detectable in upper and lower  respiratory specimens dur ing the acute phase of infection.  Positive  results are indicative of active infection with SARS-CoV-2.  Clinical  correlation with patient history and other diagnostic information is  necessary to determine patient  infection status.  Positive results do  not rule out bacterial infection or co-infection with other viruses. If result is PRESUMPTIVE POSTIVE SARS-CoV-2 nucleic acids MAY BE PRESENT.   A presumptive positive result was obtained on the submitted specimen  and confirmed on repeat testing.  While 2019 novel coronavirus  (SARS-CoV-2) nucleic acids may be present in  the submitted sample  additional confirmatory testing may be necessary for epidemiological  and / or clinical management purposes  to differentiate between  SARS-CoV-2 and other Sarbecovirus currently known to infect humans.  If clinically indicated additional testing with an alternate test  methodology [id number]) is advised. The SARS-CoV-2 RNA is generally  detectable in upper and lower respiratory sp ecimens during the acute  phase of infection. The expected result is Negative. Fact Sheet for Patients:  StrictlyIdeas.no Fact Sheet for Healthcare Providers: BankingDealers.co.za This test is not yet approved or cleared by the [place] FDA and has been authorized for detection and/or diagnosis of SARS-CoV-2 by FDA under an Emergency Use Authorization (EUA).  This EUA will remain in effect (meaning this test can be used) for the duration of the COVID-19 declaration under Section 564(b)(1) of the Act, 21 U.S.C. section 360bbb-3(b)(1), unless the authorization is terminated or revoked sooner. Performed at [facility], [place] [address], [place], [place] [address]     Ct Abdomen Pelvis W Contrast  Result Date: 03/26/2019 CLINICAL DATA:  Abdominal pain with diverticulitis suspected. EXAM: CT ABDOMEN AND PELVIS WITH CONTRAST TECHNIQUE: Multidetector CT imaging of the abdomen and pelvis was performed using the standard protocol following bolus administration of intravenous contrast. CONTRAST:  153mL OMNIPAQUE IOHEXOL 300 MG/ML  SOLN COMPARISON:  None. FINDINGS: Lower chest: There  is atelectasis at the lung bases.The heart size is normal. Hepatobiliary: There is decreased hepatic attenuation suggestive of hepatic steatosis. Normal gallbladder.There is no biliary ductal dilation. Pancreas: Normal contours without ductal dilatation. No peripancreatic fluid collection. Spleen: No splenic laceration or hematoma. Adrenals/Urinary Tract: --Adrenal glands: No adrenal hemorrhage. --Right kidney/ureter: No hydronephrosis or perinephric hematoma. --Left kidney/ureter: No hydronephrosis or perinephric hematoma. --Urinary bladder: There is some asymmetric bladder wall thickening which is presumably reactive. Stomach/Bowel: --Stomach/Duodenum: No hiatal hernia or other gastric abnormality. Normal duodenal course and caliber. --Small bowel: No dilatation or inflammation. --Colon: There is sigmoid diverticulitis. There is an adjacent fluid collection measuring 4.1 x 2.1 cm. There are scattered colonic diverticula. --Appendix: Not visualized. No right lower quadrant inflammation or free fluid. Vascular/Lymphatic: Normal course and caliber of the major abdominal vessels. There is a circumaortic left renal vein, a normal variant. --No retroperitoneal lymphadenopathy. --No mesenteric lymphadenopathy. --No pelvic or inguinal lymphadenopathy. Reproductive: Unremarkable Other: There is a trace amount of extraluminal free air. There is reactive free fluid in the left pericolic gutter. There are bilateral fat containing inguinal hernias. Musculoskeletal. No acute displaced fractures. IMPRESSION: 1. Findings consistent with sigmoid diverticulitis. There is an adjacent 4.1 x 2.1 cm fluid collection which may represent a collection of reactive free fluid. A developing abscess is not excluded. There is a trace amount of extraluminal free air adjacent to this fluid collection, best visualized on the coronal view. 2. Hepatic steatosis. 3. Asymmetric bladder wall thickening, presumably reactive. Electronically Signed   By:  Constance Holster M.D.   On: 03/26/2019 21:02   Dg Chest Port 1 View  Result Date: 03/26/2019 CLINICAL DATA:  Fever EXAM: PORTABLE CHEST 1 VIEW COMPARISON:  None. FINDINGS: This is a mildly low volume film. The cardiomediastinal silhouette is unremarkable. Peribronchial thickening noted. There is no evidence of focal airspace disease, pulmonary edema, suspicious pulmonary nodule/mass, pleural effusion, or pneumothorax. No acute bony abnormalities are identified. IMPRESSION: Peribronchial thickening without evidence of focal airspace disease. Electronically Signed   By: Margarette Canada M.D.   On: 03/26/2019 19:44    Review of Systems  Constitutional: Positive for  chills and fever. Negative for weight loss.  HENT: Negative for nosebleeds.   Eyes: Negative for blurred vision.  Respiratory: Negative for shortness of breath.   Cardiovascular: Negative for chest pain, palpitations, orthopnea and PND.       Denies DOE  Gastrointestinal: Positive for abdominal pain and nausea. Negative for blood in stool and melena.  Genitourinary: Negative for dysuria and hematuria.  Musculoskeletal: Negative.   Skin: Negative for itching and rash.  Neurological: Negative for dizziness, focal weakness, seizures, loss of consciousness and headaches.       Denies TIAs, amaurosis fugax  Endo/Heme/Allergies: Does not bruise/bleed easily.  Psychiatric/Behavioral: The patient is not nervous/anxious.        Lot of stress recently   Blood pressure 115/76, pulse 89, temperature 99.5 F (37.5 C), temperature source Oral, resp. rate 20, height 5\' 10"  (1.778 m), weight 86.2 kg, SpO2 97 %. Physical Exam  Vitals reviewed. Constitutional: He is oriented to person, place, and time. He appears well-developed and well-nourished. He appears ill. No distress.  HENT:  Head: Normocephalic and atraumatic.  Right Ear: External ear normal.  Left Ear: External ear normal.  Eyes: Conjunctivae are normal. No scleral icterus.  Neck:  Normal range of motion. Neck supple. No tracheal deviation present. No thyromegaly present.  Cardiovascular: Normal rate and normal heart sounds.  Respiratory: Effort normal and breath sounds normal. No stridor. No respiratory distress. He has no wheezes.  GI: Soft. Normal appearance. He exhibits distension. There is abdominal tenderness in the left lower quadrant. There is guarding (voluntary). There is no rigidity and no rebound.    Soft, full abdomen; significant tenderness to palpation in left lower quadrant but no rebound or peritonitis.  Positive voluntary guarding.  Tenderness extends to suprapubic area  Musculoskeletal:        General: No tenderness or edema.  Lymphadenopathy:    He has no cervical adenopathy.  Neurological: He is alert and oriented to person, place, and time. He exhibits normal muscle tone.  Skin: Skin is warm and dry. No rash noted. He is not diaphoretic. No erythema. No pallor.  Psychiatric: He has a normal mood and affect. His behavior is normal. Judgment and thought content normal.    Assessment/Plan: Sigmoid diverticulitis with microperforation with adjacent fluid collection Hypertension Depression Hypokalemia Mild protein calorie malnutrition Tobacco use  It appears that he has a probable microperforation from his diverticulitis.  There is a small adjacent fluid collection but I do not think it is amendable to percutaneous drain.  It may develop into a larger abscess over the next few days.  Right now he is resting comfortably.  His abdomen soft he does have a fair amount of tenderness in the left lower quadrant but there is no signs of peritonitis.  There is no generalized free air.  Therefore I think we can start with IV antibiotics and bowel rest.  He can have essential meds by mouth  IV fluids Broad-spectrum IV antibiotics Chemical DVT prophylaxis Replace potassium Repeat labs in the morning May have sips with meds  We discussed diverticulosis and  diverticulitis in the different stages of diverticulitis.  We discussed the general hospital course with his type of diverticulitis.  Hopefully he will succeed with medical management.  If he does he will need an outpatient colonoscopy in 6 to 8 weeks to evaluate the colon to rule out other pathology.  Victor Ruff. Redmond Pulling, MD, FACS General, Bariatric, & Minimally Invasive Surgery [facility], [place]  Greer Pickerel 03/26/2019, 11:57 PM

## 2019-03-26 NOTE — ED Triage Notes (Signed)
Onset of lower abdominal pain (left greater than right), that started on Friday. Some difficulty urinating, no blood in the urine. Yesterday temp 101.4.  No n/v/d. 2 tylenol around 1500.

## 2019-03-26 NOTE — ED Notes (Signed)
ED TO INPATIENT HANDOFF REPORT  ED Nurse Name and Phone #: Benjamine Mola [phone]  S Name/Age/Gender Victor Boyer 53 y.o. male Room/Bed: 028C/028C  Code Status   Code Status: Prior  Home/SNF/Other Home Patient oriented to: self, place, time and situation Is this baseline? Yes   Triage Complete: Triage complete  Chief Complaint abd pain  Triage Note Onset of lower abdominal pain (left greater than right), that started on Friday. Some difficulty urinating, no blood in the urine. Yesterday temp 101.4.  No n/v/d. 2 tylenol around 1500.    Allergies No Known Allergies  Level of Care/Admitting Diagnosis ED Disposition    ED Disposition Condition Comment   Admit  Hospital Area: [facility] [[id number]]  Level of Care: Telemetry Medical [104]  Covid Evaluation: N/A  Diagnosis: Acute diverticulitis [id number]  Admitting Physician: Rise Patience [phone]  Attending Physician: Rise Patience [phone]  Estimated length of stay: past midnight tomorrow  Certification:: I certify this patient will need inpatient services for at least 2 midnights  PT Class (Do Not Modify): Inpatient [101]  PT Acc Code (Do Not Modify): Private [1]       B Medical/Surgery History Past Medical History:  Diagnosis Date  . Anxiety   . Depression   . High cholesterol   . Hypertension    Past Surgical History:  Procedure Laterality Date  . APPENDECTOMY       A IV Location/Drains/Wounds Patient Lines/Drains/Airways Status   Active Line/Drains/Airways    Name:   Placement date:   Placement time:   Site:   Days:   Peripheral IV 03/26/19 Right Antecubital   03/26/19    1828    Antecubital   less than 1   Peripheral IV 03/26/19   03/26/19    1939    -   less than 1          Intake/Output Last 24 hours  Intake/Output Summary (Last 24 hours) at 03/26/2019 2226 Last data filed at 03/26/2019 2225 Gross per 24 hour  Intake 1199.77 ml  Output -  Net 1199.77 ml     Labs/Imaging Results for orders placed or performed during the hospital encounter of 03/26/19 (from the past 48 hour(s))  Lipase, blood     Status: None   Collection Time: 03/26/19  6:29 PM  Result Value Ref Range   Lipase 21 11 - 51 U/L    Comment: Performed at [facility], [address] [place], [place] [address]  Comprehensive metabolic panel     Status: Abnormal   Collection Time: 03/26/19  6:29 PM  Result Value Ref Range   Sodium 128 (L) 135 - 145 mmol/L   Potassium 3.3 (L) 3.5 - 5.1 mmol/L   Chloride 93 (L) 98 - 111 mmol/L   CO2 23 22 - 32 mmol/L   Glucose, Bld 108 (H) 70 - 99 mg/dL   BUN 14 6 - 20 mg/dL   Creatinine, Ser 0.90 0.61 - 1.24 mg/dL   Calcium 8.6 (L) 8.9 - 10.3 mg/dL   Total Protein 6.6 6.5 - 8.1 g/dL   Albumin 3.3 (L) 3.5 - 5.0 g/dL   AST 12 (L) 15 - 41 U/L   ALT 13 0 - 44 U/L   Alkaline Phosphatase 57 38 - 126 U/L   Total Bilirubin 1.0 0.3 - 1.2 mg/dL   GFR calc non Af Amer >60 >60 mL/min   GFR calc Af Amer >60 >60 mL/min   Anion gap 12 5 -  15    Comment: Performed at [facility], [place] [address] [place], [place] [address]  CBC     Status: Abnormal   Collection Time: 03/26/19  6:29 PM  Result Value Ref Range   WBC 20.9 (H) 4.0 - 10.5 K/uL   RBC 4.78 4.22 - 5.81 MIL/uL   Hemoglobin 15.0 13.0 - 17.0 g/dL   HCT 43.5 39.0 - 52.0 %   MCV 91.0 80.0 - 100.0 fL   MCH 31.4 26.0 - 34.0 pg   MCHC 34.5 30.0 - 36.0 g/dL   RDW 14.6 11.5 - 15.5 %   Platelets 302 150 - 400 K/uL   nRBC 0.0 0.0 - 0.2 %    Comment: Performed at [facility], [place] [address], [place], [place] [address]  Lactic acid, plasma     Status: None   Collection Time: 03/26/19  6:35 PM  Result Value Ref Range   Lactic Acid, Venous 0.9 0.5 - 1.9 mmol/L    Comment: Performed at [facility] [address] [place], [place] [address]  Urinalysis, Routine w reflex microscopic     Status: Abnormal   Collection Time: 03/26/19  6:57 PM  Result Value Ref Range    Color, Urine YELLOW YELLOW   APPearance CLEAR CLEAR   Specific Gravity, Urine >1.046 (H) 1.005 - 1.030   pH 6.0 5.0 - 8.0   Glucose, UA NEGATIVE NEGATIVE mg/dL   Hgb urine dipstick SMALL (A) NEGATIVE   Bilirubin Urine NEGATIVE NEGATIVE   Ketones, ur NEGATIVE NEGATIVE mg/dL   Protein, ur NEGATIVE NEGATIVE mg/dL   Nitrite NEGATIVE NEGATIVE   Leukocytes,Ua NEGATIVE NEGATIVE   RBC / HPF 11-20 0 - 5 RBC/hpf   WBC, UA 0-5 0 - 5 WBC/hpf   Bacteria, UA NONE SEEN NONE SEEN   Mucus PRESENT     Comment: Performed at [facility] [address], [place], [place] [address]  SARS Coronavirus 2 [facility] order, Performed in [facility] hospital lab) Nasopharyngeal Nasopharyngeal Swab     Status: None   Collection Time: 03/26/19  6:57 PM   Specimen: Nasopharyngeal Swab  Result Value Ref Range   SARS Coronavirus 2 NEGATIVE NEGATIVE    Comment: (NOTE) If result is NEGATIVE SARS-CoV-2 target nucleic acids are NOT DETECTED. The SARS-CoV-2 RNA is generally detectable in upper and lower  respiratory specimens during the acute phase of infection. The lowest  concentration of SARS-CoV-2 viral copies this assay can detect is 250  copies / mL. A negative result does not preclude SARS-CoV-2 infection  and should not be used as the sole basis for treatment or other  patient management decisions.  A negative result may occur with  improper specimen collection / handling, submission of specimen other  than nasopharyngeal swab, presence of viral mutation(s) within the  areas targeted by this assay, and inadequate number of viral copies  (<250 copies / mL). A negative result must be combined with clinical  observations, patient history, and epidemiological information. If result is POSITIVE SARS-CoV-2 target nucleic acids are DETECTED. The SARS-CoV-2 RNA is generally detectable in upper and lower  respiratory specimens dur ing the acute phase of infection.  Positive  results are indicative of active  infection with SARS-CoV-2.  Clinical  correlation with patient history and other diagnostic information is  necessary to determine patient infection status.  Positive results do  not rule out bacterial infection or co-infection with other viruses. If result is PRESUMPTIVE POSTIVE  SARS-CoV-2 nucleic acids MAY BE PRESENT.   A presumptive positive result was obtained on the submitted specimen  and confirmed on repeat testing.  While 2019 novel coronavirus  (SARS-CoV-2) nucleic acids may be present in the submitted sample  additional confirmatory testing may be necessary for epidemiological  and / or clinical management purposes  to differentiate between  SARS-CoV-2 and other Sarbecovirus currently known to infect humans.  If clinically indicated additional testing with an alternate test  methodology [id number]) is advised. The SARS-CoV-2 RNA is generally  detectable in upper and lower respiratory sp ecimens during the acute  phase of infection. The expected result is Negative. Fact Sheet for Patients:  StrictlyIdeas.no Fact Sheet for Healthcare Providers: BankingDealers.co.za This test is not yet approved or cleared by the [place] FDA and has been authorized for detection and/or diagnosis of SARS-CoV-2 by FDA under an Emergency Use Authorization (EUA).  This EUA will remain in effect (meaning this test can be used) for the duration of the COVID-19 declaration under Section 564(b)(1) of the Act, 21 U.S.C. section 360bbb-3(b)(1), unless the authorization is terminated or revoked sooner. Performed at [facility], [place] [address], [place], [place] [address]    Ct Abdomen Pelvis W Contrast  Result Date: 03/26/2019 CLINICAL DATA:  Abdominal pain with diverticulitis suspected. EXAM: CT ABDOMEN AND PELVIS WITH CONTRAST TECHNIQUE: Multidetector CT imaging of the abdomen and pelvis was performed using the standard protocol following bolus  administration of intravenous contrast. CONTRAST:  125mL OMNIPAQUE IOHEXOL 300 MG/ML  SOLN COMPARISON:  None. FINDINGS: Lower chest: There is atelectasis at the lung bases.The heart size is normal. Hepatobiliary: There is decreased hepatic attenuation suggestive of hepatic steatosis. Normal gallbladder.There is no biliary ductal dilation. Pancreas: Normal contours without ductal dilatation. No peripancreatic fluid collection. Spleen: No splenic laceration or hematoma. Adrenals/Urinary Tract: --Adrenal glands: No adrenal hemorrhage. --Right kidney/ureter: No hydronephrosis or perinephric hematoma. --Left kidney/ureter: No hydronephrosis or perinephric hematoma. --Urinary bladder: There is some asymmetric bladder wall thickening which is presumably reactive. Stomach/Bowel: --Stomach/Duodenum: No hiatal hernia or other gastric abnormality. Normal duodenal course and caliber. --Small bowel: No dilatation or inflammation. --Colon: There is sigmoid diverticulitis. There is an adjacent fluid collection measuring 4.1 x 2.1 cm. There are scattered colonic diverticula. --Appendix: Not visualized. No right lower quadrant inflammation or free fluid. Vascular/Lymphatic: Normal course and caliber of the major abdominal vessels. There is a circumaortic left renal vein, a normal variant. --No retroperitoneal lymphadenopathy. --No mesenteric lymphadenopathy. --No pelvic or inguinal lymphadenopathy. Reproductive: Unremarkable Other: There is a trace amount of extraluminal free air. There is reactive free fluid in the left pericolic gutter. There are bilateral fat containing inguinal hernias. Musculoskeletal. No acute displaced fractures. IMPRESSION: 1. Findings consistent with sigmoid diverticulitis. There is an adjacent 4.1 x 2.1 cm fluid collection which may represent a collection of reactive free fluid. A developing abscess is not excluded. There is a trace amount of extraluminal free air adjacent to this fluid collection, best  visualized on the coronal view. 2. Hepatic steatosis. 3. Asymmetric bladder wall thickening, presumably reactive. Electronically Signed   By: Constance Holster M.D.   On: 03/26/2019 21:02   Dg Chest Port 1 View  Result Date: 03/26/2019 CLINICAL DATA:  Fever EXAM: PORTABLE CHEST 1 VIEW COMPARISON:  None. FINDINGS: This is a mildly low volume film. The cardiomediastinal silhouette is unremarkable. Peribronchial thickening noted. There is no evidence of focal airspace disease, pulmonary edema, suspicious pulmonary nodule/mass, pleural effusion, or pneumothorax. No acute bony  abnormalities are identified. IMPRESSION: Peribronchial thickening without evidence of focal airspace disease. Electronically Signed   By: Margarette Canada M.D.   On: 03/26/2019 19:44    Pending Labs Unresulted Labs (From admission, onward)    Start     Ordered   03/26/19 1907  APTT  ONCE - STAT,   STAT     03/26/19 1907   03/26/19 [place],   STAT     03/26/19 1907   03/26/19 1907  Blood Culture (routine x 2)  BLOOD CULTURE X 2,   STAT     03/26/19 1907   03/26/19 1907  Urine culture  ONCE - STAT,   STAT     03/26/19 1907   Signed and Held  HIV antibody (Routine Testing)  Tomorrow morning,   R     Signed and Held   Signed and Held  CBC WITH DIFFERENTIAL  Tomorrow morning,   R     Signed and Held   Signed and Held  Comprehensive metabolic panel  Tomorrow morning,   R     Signed and Held          Vitals/Pain Today's Vitals   03/26/19 2000 03/26/19 2004 03/26/19 2015 03/26/19 2100  BP: 121/76  125/75 111/76  Pulse: 89  88 86  Resp: (!) 37  (!) 28 (!) 24  Temp:  99.5 F (37.5 C)    TempSrc:  Oral    SpO2: 96%  96% 96%  Weight:      Height:      PainSc:        Isolation Precautions No active isolations  Medications Medications  sodium chloride flush (NS) 0.9 % injection 3 mL (3 mLs Intravenous Not Given 03/26/19 2002)  ceFEPIme (MAXIPIME) 2 g in sodium chloride 0.9 % 100 mL IVPB (has no  administration in time range)  vancomycin (VANCOCIN) 1,250 mg in sodium chloride 0.9 % 250 mL IVPB (has no administration in time range)  0.9 %  sodium chloride infusion (has no administration in time range)  hydrALAZINE (APRESOLINE) injection 10 mg (has no administration in time range)  acetaminophen (TYLENOL) tablet 650 mg (650 mg Oral Given 03/26/19 1850)  sodium chloride 0.9 % bolus 1,000 mL (0 mLs Intravenous Stopped 03/26/19 2225)  ceFEPIme (MAXIPIME) 2 g in sodium chloride 0.9 % 100 mL IVPB (0 g Intravenous Stopped 03/26/19 2026)  metroNIDAZOLE (FLAGYL) IVPB 500 mg (0 mg Intravenous Stopped 03/26/19 2101)  vancomycin (VANCOCIN) 2,000 mg in sodium chloride 0.9 % 500 mL IVPB (2,000 mg Intravenous New Bag/Given 03/26/19 1951)  iohexol (OMNIPAQUE) 300 MG/ML solution 100 mL (100 mLs Intravenous Contrast Given 03/26/19 2028)    Mobility walks Low fall risk   Focused Assessments        R Recommendations: See Admitting Provider Note  Report given to:   Additional Notes:

## 2019-03-26 NOTE — ED Notes (Signed)
Pt made aware of the need for urine.

## 2019-03-26 NOTE — Progress Notes (Signed)
Pharmacy Antibiotic Note  Victor Boyer is a 53 y.o. male admitted on 03/26/2019 with sepsis.  Pharmacy has been consulted for vancomycin/cefepime dosing. Tmax/24h 103.3, WBC 20.9. SCr 0.9 on admit.  Plan: Cefepime 2g IV x 1; then 2g IV q8h Vancomycin 2g IV x1; then Vancomycin 1250 mg IV Q 12 hrs. Goal AUC 400-550. Expected AUC: 460 SCr used: 0.9 Flagyl 500mg  IV x 1 per EDP - f/u if to continue Monitor clinical progress, c/s, renal function F/u de-escalation plan/LOT, vancomycin levels as indicated   Height: 5\' 10"  (177.8 cm) Weight: 190 lb (86.2 kg) IBW/kg (Calculated) : 73  Temp (24hrs), Avg:101.8 F (38.8 C), Min:100.3 F (37.9 C), Max:103.3 F (39.6 C)  Recent Labs  Lab 03/26/19 1829  WBC 20.9*  CREATININE 0.90    Estimated Creatinine Clearance: 100.3 mL/min (by C-G formula based on SCr of 0.9 mg/dL).    No Known Allergies  Antimicrobials this admission: 8/30 vancomycin >>  8/30 cefepime >>  8/30 flagyl x 1  Dose adjustments this admission:   Microbiology results:   Elicia Lamp, PharmD, BCPS Clinical Pharmacist 03/26/2019 7:18 PM

## 2019-03-27 LAB — CBC WITH DIFFERENTIAL/PLATELET
Abs Immature Granulocytes: 0.11 10*3/uL — ABNORMAL HIGH (ref 0.00–0.07)
Basophils Absolute: 0.1 10*3/uL (ref 0.0–0.1)
Basophils Relative: 0 %
Eosinophils Absolute: 0 10*3/uL (ref 0.0–0.5)
Eosinophils Relative: 0 %
HCT: 39 % (ref 39.0–52.0)
Hemoglobin: 13.1 g/dL (ref 13.0–17.0)
Immature Granulocytes: 1 %
Lymphocytes Relative: 8 %
Lymphs Abs: 1.5 10*3/uL (ref 0.7–4.0)
MCH: 30.8 pg (ref 26.0–34.0)
MCHC: 33.6 g/dL (ref 30.0–36.0)
MCV: 91.8 fL (ref 80.0–100.0)
Monocytes Absolute: 2 10*3/uL — ABNORMAL HIGH (ref 0.1–1.0)
Monocytes Relative: 10 %
Neutro Abs: 15.8 10*3/uL — ABNORMAL HIGH (ref 1.7–7.7)
Neutrophils Relative %: 81 %
Platelets: 265 10*3/uL (ref 150–400)
RBC: 4.25 MIL/uL (ref 4.22–5.81)
RDW: 14.6 % (ref 11.5–15.5)
WBC: 19.5 10*3/uL — ABNORMAL HIGH (ref 4.0–10.5)
nRBC: 0 % (ref 0.0–0.2)

## 2019-03-27 LAB — URINE CULTURE: Culture: NO GROWTH

## 2019-03-27 LAB — COMPREHENSIVE METABOLIC PANEL
ALT: 12 U/L (ref 0–44)
AST: 13 U/L — ABNORMAL LOW (ref 15–41)
Albumin: 2.7 g/dL — ABNORMAL LOW (ref 3.5–5.0)
Alkaline Phosphatase: 54 U/L (ref 38–126)
Anion gap: 11 (ref 5–15)
BUN: 10 mg/dL (ref 6–20)
CO2: 20 mmol/L — ABNORMAL LOW (ref 22–32)
Calcium: 7.9 mg/dL — ABNORMAL LOW (ref 8.9–10.3)
Chloride: 98 mmol/L (ref 98–111)
Creatinine, Ser: 0.83 mg/dL (ref 0.61–1.24)
GFR calc Af Amer: >60 mL/min (ref >60)
GFR calc non Af Amer: >60 mL/min (ref >60)
Glucose, Bld: 91 mg/dL (ref 70–99)
Potassium: 3.2 mmol/L — ABNORMAL LOW (ref 3.5–5.1)
Sodium: 129 mmol/L — ABNORMAL LOW (ref 135–145)
Total Bilirubin: 1.2 mg/dL (ref 0.3–1.2)
Total Protein: 5.9 g/dL — ABNORMAL LOW (ref 6.5–8.1)

## 2019-03-27 LAB — HIV ANTIBODY (ROUTINE TESTING W REFLEX): HIV Screen 4th Generation wRfx: NONREACTIVE

## 2019-03-27 MED ORDER — CITALOPRAM HYDROBROMIDE 20 MG PO TABS
20.0000 mg | ORAL_TABLET | Freq: Every day | ORAL | Status: DC
  Administered 2019-03-27 – 2019-04-02 (×7): 20 mg via ORAL
  Filled 2019-03-27 (×8): qty 1

## 2019-03-27 MED ORDER — ENOXAPARIN SODIUM 40 MG/0.4ML ~~LOC~~ SOLN
40.0000 mg | Freq: Every day | SUBCUTANEOUS | Status: DC
  Administered 2019-03-27 – 2019-03-31 (×5): 40 mg via SUBCUTANEOUS
  Filled 2019-03-27 (×6): qty 0.4

## 2019-03-27 MED ORDER — TRAZODONE HCL 50 MG PO TABS
50.0000 mg | ORAL_TABLET | Freq: Every day | ORAL | Status: DC
  Administered 2019-03-27 – 2019-04-01 (×6): 50 mg via ORAL
  Filled 2019-03-27 (×6): qty 1

## 2019-03-27 MED ORDER — BUSPIRONE HCL 5 MG PO TABS
5.0000 mg | ORAL_TABLET | Freq: Two times a day (BID) | ORAL | Status: DC
  Administered 2019-03-27 – 2019-04-02 (×13): 5 mg via ORAL
  Filled 2019-03-27 (×13): qty 1

## 2019-03-27 MED ORDER — LISINOPRIL 20 MG PO TABS
20.0000 mg | ORAL_TABLET | Freq: Every day | ORAL | Status: DC
  Administered 2019-03-27 – 2019-04-02 (×7): 20 mg via ORAL
  Filled 2019-03-27 (×7): qty 1

## 2019-03-27 MED ORDER — POTASSIUM CHLORIDE 10 MEQ/100ML IV SOLN
10.0000 meq | INTRAVENOUS | Status: AC
  Administered 2019-03-27 (×2): 10 meq via INTRAVENOUS
  Filled 2019-03-27 (×2): qty 100

## 2019-03-27 MED ORDER — PIPERACILLIN-TAZOBACTAM 3.375 G IVPB
3.3750 g | Freq: Three times a day (TID) | INTRAVENOUS | Status: DC
  Administered 2019-03-27 – 2019-03-30 (×10): 3.375 g via INTRAVENOUS
  Filled 2019-03-27 (×13): qty 50

## 2019-03-27 MED ORDER — POTASSIUM CHLORIDE 10 MEQ/100ML IV SOLN
10.0000 meq | INTRAVENOUS | Status: AC
  Administered 2019-03-27 (×3): 10 meq via INTRAVENOUS
  Filled 2019-03-27 (×4): qty 100

## 2019-03-27 MED ORDER — TRAZODONE HCL 50 MG PO TABS: 50.0000 mg | ORAL_TABLET | Freq: Every day | ORAL | Status: DC

## 2019-03-27 MED ORDER — FENOFIBRATE 160 MG PO TABS
160.0000 mg | ORAL_TABLET | Freq: Every day | ORAL | Status: DC
  Administered 2019-03-27 – 2019-04-02 (×7): 160 mg via ORAL
  Filled 2019-03-27 (×7): qty 1

## 2019-03-27 NOTE — Progress Notes (Addendum)
PROGRESS NOTE    Victor Boyer  [id number] DOB: October 18, 1965 DOA: 03/26/2019 PCP: Patient, No Pcp Per   Brief Narrative:  Patient is a 53 year old male with history of hypertension, hyperlipidemia, depression, anxiety who presented with abdominal pain in the left lower quadrant.  In the emergency department he was found to be febrile.  CT abdomen/pelvis showed sigmoid diverticulitis with possible microperforation and fluid collection.  General surgery consulted.  Started on conservative management.  Assessment & Plan:   Principal Problem:   Acute diverticulitis Active Problems:   Major depressive disorder, single episode, severe (HCC)   SIRS (systemic inflammatory response syndrome) (HCC)   Essential hypertension   Sigmoid diverticulitis: CT finding as above.  Suspected microperforation with possible abscess but not amenable for drainage.  General surgery following.  Continue pain management.  N.p.o.  Continue Zosyn.  Continue IV fluids Has severe leukocytosis.  Currently afebrile. He needs colonoscopy as an outpatient in 6 to 8 weeks.  Never had a colonoscopy in the past.  Hypertension: Currently blood pressure stable.  Continue to monitor blood pressure.  Continue PRN meds  Hyperlipidemia: On fenofibrate at home  History of anxiety/depression: On Celexa, BuSpar and trazodone at home.          DVT prophylaxis: Lovenox Code Status: Full Family Communication: None present at the bedside.  Discussed the treatment plan with the patient Disposition Plan: Home after resolution of abdominal pain, diet tolerance   Consultants: general surgery  Procedures: None  Antimicrobials:  Anti-infectives (From admission, onward)   Start     Dose/Rate Route Frequency Ordered Stop   03/27/19 0830  vancomycin (VANCOCIN) 1,250 mg in sodium chloride 0.9 % 250 mL IVPB  Status:  Discontinued     1,250 mg 166.7 mL/hr over 90 Minutes Intravenous Every 12 hours 03/26/19 1922 03/27/19 0739   03/27/19 0800  piperacillin-tazobactam (ZOSYN) IVPB 3.375 g     3.375 g 12.5 mL/hr over 240 Minutes Intravenous Every 8 hours 03/27/19 0750     03/27/19 0600  metroNIDAZOLE (FLAGYL) IVPB 500 mg  Status:  Discontinued     500 mg 100 mL/hr over 60 Minutes Intravenous Every 8 hours 03/26/19 2313 03/27/19 0739   03/27/19 0330  ceFEPIme (MAXIPIME) 2 g in sodium chloride 0.9 % 100 mL IVPB  Status:  Discontinued     2 g 200 mL/hr over 30 Minutes Intravenous Every 8 hours 03/26/19 1922 03/27/19 0739   03/26/19 1930  vancomycin (VANCOCIN) 2,000 mg in sodium chloride 0.9 % 500 mL IVPB     2,000 mg 250 mL/hr over 120 Minutes Intravenous  Once 03/26/19 1919 03/26/19 2151   03/26/19 1915  ceFEPIme (MAXIPIME) 2 g in sodium chloride 0.9 % 100 mL IVPB     2 g 200 mL/hr over 30 Minutes Intravenous  Once 03/26/19 1910 03/26/19 2026   03/26/19 1915  metroNIDAZOLE (FLAGYL) IVPB 500 mg     500 mg 100 mL/hr over 60 Minutes Intravenous  Once 03/26/19 1910 03/26/19 2101   03/26/19 1915  vancomycin (VANCOCIN) IVPB 1000 mg/200 mL premix  Status:  Discontinued     1,000 mg 200 mL/hr over 60 Minutes Intravenous  Once 03/26/19 1910 03/26/19 1919      Subjective:  Patient seen and examined at bedside this morning.  Abdomen pain slightly better today.  No nausea or vomiting.  Hemodynamically stable.  Objective: Vitals:   03/26/19 2230 03/26/19 2306 03/27/19 0538 03/27/19 1012  BP: 108/67 115/76 124/74 120/72  Pulse: 81 89 74  Resp:  20 20   Temp:  99.5 F (37.5 C) 99.8 F (37.7 C)   TempSrc:  Oral Oral   SpO2: 94% 97% 94%   Weight:   89.6 kg   Height:        Intake/Output Summary (Last 24 hours) at 03/27/2019 1100 Last data filed at 03/27/2019 0541 Gross per 24 hour  Intake 2359.33 ml  Output 400 ml  Net 1959.33 ml   Filed Weights   03/26/19 1807 03/27/19 0538  Weight: 86.2 kg 89.6 kg    Examination:  General exam: Not in distress,average built HEENT:PERRL,Oral mucosa moist, Ear/Nose normal  on gross exam Respiratory system: Bilateral equal air entry, normal vesicular breath sounds, no wheezes or crackles  Cardiovascular system: S1 & S2 heard, RRR. No JVD, murmurs, rubs, gallops or clicks. No pedal edema. Gastrointestinal system: Abdomen is nondistended, soft.tenderness on the left lower quadrant.   Central nervous system: Alert and oriented. No focal neurological deficits. Extremities: No edema, no clubbing ,no cyanosis, distal peripheral pulses palpable. Skin: No rashes, lesions or ulcers,no icterus ,no pallor MSK: Normal muscle bulk,tone ,power    Data Reviewed: I have personally reviewed following labs and imaging studies  CBC: Recent Labs  Lab 03/26/19 1829 03/27/19 0600  WBC 20.9* 19.5*  NEUTROABS  --  15.8*  HGB 15.0 13.1  HCT 43.5 39.0  MCV 91.0 91.8  PLT 302 [id number]   Basic Metabolic Panel: Recent Labs  Lab 03/26/19 1829 03/27/19 0600  NA 128* 129*  K 3.3* 3.2*  CL 93* 98  CO2 23 20*  GLUCOSE 108* 91  BUN 14 10  CREATININE 0.90 0.83  CALCIUM 8.6* 7.9*   GFR: Estimated Creatinine Clearance: 118.5 mL/min (by C-G formula based on SCr of 0.83 mg/dL). Liver Function Tests: Recent Labs  Lab 03/26/19 1829 03/27/19 0600  AST 12* 13*  ALT 13 12  ALKPHOS 57 54  BILITOT 1.0 1.2  PROT 6.6 5.9*  ALBUMIN 3.3* 2.7*   Recent Labs  Lab 03/26/19 1829  LIPASE 21   No results for input(s): AMMONIA in the last 168 hours. Coagulation Profile: No results for input(s): INR, PROTIME in the last 168 hours. Cardiac Enzymes: No results for input(s): CKTOTAL, CKMB, CKMBINDEX, TROPONINI in the last 168 hours. BNP (last 3 results) No results for input(s): PROBNP in the last 8760 hours. HbA1C: No results for input(s): HGBA1C in the last 72 hours. CBG: No results for input(s): GLUCAP in the last 168 hours. Lipid Profile: No results for input(s): CHOL, HDL, LDLCALC, TRIG, CHOLHDL, LDLDIRECT in the last 72 hours. Thyroid Function Tests: No results for input(s):  TSH, T4TOTAL, FREET4, T3FREE, THYROIDAB in the last 72 hours. Anemia Panel: No results for input(s): VITAMINB12, FOLATE, FERRITIN, TIBC, IRON, RETICCTPCT in the last 72 hours. Sepsis Labs: Recent Labs  Lab 03/26/19 1835  LATICACIDVEN 0.9    Recent Results (from the past 240 hour(s))  SARS Coronavirus 2 [facility] order, Performed in [facility] hospital lab) Nasopharyngeal Nasopharyngeal Swab     Status: None   Collection Time: 03/26/19  6:57 PM   Specimen: Nasopharyngeal Swab  Result Value Ref Range Status   SARS Coronavirus 2 NEGATIVE NEGATIVE Final    Comment: (NOTE) If result is NEGATIVE SARS-CoV-2 target nucleic acids are NOT DETECTED. The SARS-CoV-2 RNA is generally detectable in upper and lower  respiratory specimens during the acute phase of infection. The lowest  concentration of SARS-CoV-2 viral copies this assay can detect is 250  copies / mL. A  negative result does not preclude SARS-CoV-2 infection  and should not be used as the sole basis for treatment or other  patient management decisions.  A negative result may occur with  improper specimen collection / handling, submission of specimen other  than nasopharyngeal swab, presence of viral mutation(s) within the  areas targeted by this assay, and inadequate number of viral copies  (<250 copies / mL). A negative result must be combined with clinical  observations, patient history, and epidemiological information. If result is POSITIVE SARS-CoV-2 target nucleic acids are DETECTED. The SARS-CoV-2 RNA is generally detectable in upper and lower  respiratory specimens dur ing the acute phase of infection.  Positive  results are indicative of active infection with SARS-CoV-2.  Clinical  correlation with patient history and other diagnostic information is  necessary to determine patient infection status.  Positive results do  not rule out bacterial infection or co-infection with other viruses. If result is PRESUMPTIVE  POSTIVE SARS-CoV-2 nucleic acids MAY BE PRESENT.   A presumptive positive result was obtained on the submitted specimen  and confirmed on repeat testing.  While 2019 novel coronavirus  (SARS-CoV-2) nucleic acids may be present in the submitted sample  additional confirmatory testing may be necessary for epidemiological  and / or clinical management purposes  to differentiate between  SARS-CoV-2 and other Sarbecovirus currently known to infect humans.  If clinically indicated additional testing with an alternate test  methodology [id number]) is advised. The SARS-CoV-2 RNA is generally  detectable in upper and lower respiratory sp ecimens during the acute  phase of infection. The expected result is Negative. Fact Sheet for Patients:  StrictlyIdeas.no Fact Sheet for Healthcare Providers: BankingDealers.co.za This test is not yet approved or cleared by the [place] FDA and has been authorized for detection and/or diagnosis of SARS-CoV-2 by FDA under an Emergency Use Authorization (EUA).  This EUA will remain in effect (meaning this test can be used) for the duration of the COVID-19 declaration under Section 564(b)(1) of the Act, 21 U.S.C. section 360bbb-3(b)(1), unless the authorization is terminated or revoked sooner. Performed at [facility], [place] [address] [place], [place] [address]   Blood Culture (routine x 2)     Status: None (Preliminary result)   Collection Time: 03/26/19  6:58 PM   Specimen: BLOOD  Result Value Ref Range Status   Specimen Description BLOOD LEFT ANTECUBITAL  Final   Special Requests   Final    BOTTLES DRAWN AEROBIC AND ANAEROBIC Blood Culture adequate volume   Culture   Final    NO GROWTH < 12 HOURS Performed at [facility], [place] [address] [place], [place] [address]    Report Status PENDING  Incomplete  Blood Culture (routine x 2)     Status: None (Preliminary result)   Collection Time:  03/26/19  7:46 PM   Specimen: BLOOD RIGHT FOREARM  Result Value Ref Range Status   Specimen Description BLOOD RIGHT FOREARM  Final   Special Requests   Final    BOTTLES DRAWN AEROBIC AND ANAEROBIC Blood Culture results may not be optimal due to an inadequate volume of blood received in culture bottles   Culture   Final    NO GROWTH < 12 HOURS Performed at [facility], [place] [address] [place], [place] [address]    Report Status PENDING  Incomplete         Radiology Studies: Ct Abdomen Pelvis W Contrast  Result Date: 03/26/2019 CLINICAL DATA:  Abdominal pain with diverticulitis suspected. EXAM: CT ABDOMEN AND PELVIS WITH CONTRAST TECHNIQUE: Multidetector CT imaging of the abdomen and pelvis was performed using the standard protocol following bolus administration of intravenous contrast. CONTRAST:  170mL OMNIPAQUE IOHEXOL 300 MG/ML  SOLN COMPARISON:  None. FINDINGS: Lower chest: There is atelectasis at the lung bases.The heart size is normal. Hepatobiliary: There is decreased hepatic attenuation suggestive of hepatic steatosis. Normal gallbladder.There is no biliary ductal dilation. Pancreas: Normal contours without ductal dilatation. No peripancreatic fluid collection. Spleen: No splenic laceration or hematoma. Adrenals/Urinary Tract: --Adrenal glands: No adrenal hemorrhage. --Right kidney/ureter: No hydronephrosis or perinephric hematoma. --Left kidney/ureter: No hydronephrosis or perinephric hematoma. --Urinary bladder: There is some asymmetric bladder wall thickening which is presumably reactive. Stomach/Bowel: --Stomach/Duodenum: No hiatal hernia or other gastric abnormality. Normal duodenal course and caliber. --Small bowel: No dilatation or inflammation. --Colon: There is sigmoid diverticulitis. There is an adjacent fluid collection measuring 4.1 x 2.1 cm. There are scattered colonic diverticula. --Appendix: Not visualized. No right lower quadrant inflammation or free fluid.  Vascular/Lymphatic: Normal course and caliber of the major abdominal vessels. There is a circumaortic left renal vein, a normal variant. --No retroperitoneal lymphadenopathy. --No mesenteric lymphadenopathy. --No pelvic or inguinal lymphadenopathy. Reproductive: Unremarkable Other: There is a trace amount of extraluminal free air. There is reactive free fluid in the left pericolic gutter. There are bilateral fat containing inguinal hernias. Musculoskeletal. No acute displaced fractures. IMPRESSION: 1. Findings consistent with sigmoid diverticulitis. There is an adjacent 4.1 x 2.1 cm fluid collection which may represent a collection of reactive free fluid. A developing abscess is not excluded. There is a trace amount of extraluminal free air adjacent to this fluid collection, best visualized on the coronal view. 2. Hepatic steatosis. 3. Asymmetric bladder wall thickening, presumably reactive. Electronically Signed   By: Constance Holster M.D.   On: 03/26/2019 21:02   Dg Chest Port 1 View  Result Date: 03/26/2019 CLINICAL DATA:  Fever EXAM: PORTABLE CHEST 1 VIEW COMPARISON:  None. FINDINGS: This is a mildly low volume film. The cardiomediastinal silhouette is unremarkable. Peribronchial thickening noted. There is no evidence of focal airspace disease, pulmonary edema, suspicious pulmonary nodule/mass, pleural effusion, or pneumothorax. No acute bony abnormalities are identified. IMPRESSION: Peribronchial thickening without evidence of focal airspace disease. Electronically Signed   By: Margarette Canada M.D.   On: 03/26/2019 19:44        Scheduled Meds: . busPIRone  5 mg Oral BID  . citalopram  20 mg Oral Daily  . enoxaparin (LOVENOX) injection  40 mg Subcutaneous Daily  . fenofibrate  160 mg Oral Daily  . lisinopril  20 mg Oral Daily  . sodium chloride flush  3 mL Intravenous Once  . traZODone  50 mg Oral QHS   Continuous Infusions: . sodium chloride 125 mL/hr at 03/27/19 0011  .  piperacillin-tazobactam (ZOSYN)  IV 3.375 g (03/27/19 0954)  . potassium chloride 10 mEq (03/27/19 1001)     LOS: 1 day    Time spent:25 mins. More than 50% of that time was spent in counseling and/or coordination of care.      Shelly Coss, MD [facility] Pager [phone]  If 7PM-7AM, please contact night-coverage [email or web address] Password [email or web address] 03/27/2019, 11:00 AM

## 2019-03-27 NOTE — Progress Notes (Signed)
[facility] Progress Note     Subjective: CC: abdominal pain Patient still having pain in LLQ that is dull but constant. Had diarrhea overnight, no blood noted. Patient does not notice change in pain with defecation. No pneumaturia, although urine is concentrated. Denies nausea.    Objective: Vital signs in last 24 hours: Temp:  [99.5 F (37.5 C)-103.3 F (39.6 C)] 99.8 F (37.7 C) (08/31 0538) Pulse Rate:  [74-104] 74 (08/31 0538) Resp:  [19-38] 20 (08/31 0538) BP: (108-126)/(67-77) 124/74 (08/31 0538) SpO2:  [5 %-97 %] 94 % (08/31 0538) Weight:  [86.2 kg-89.6 kg] 89.6 kg (08/31 0538) Last BM Date: 03/26/19  Intake/Output from previous day: 08/30 0701 - 08/31 0700 In: 2359.3 [I.V.:616.2; IV Piggyback:1743.1] Out: 400 [Urine:400] Intake/Output this shift: No intake/output data recorded.  PE: Gen:  Alert, NAD, pleasant Card:  Regular rate and rhythm Pulm:  Normal effort, clear to auscultation bilaterally Abd: Soft, TTP in LLQ and suprapubic, no guarding, no rebound tenderness, non-distended, +BS Skin: warm and dry, no rashes  Psych: A&Ox3   Lab Results:  Recent Labs    03/26/19 1829 03/27/19 0600  WBC 20.9* 19.5*  HGB 15.0 13.1  HCT 43.5 39.0  PLT 302 265   BMET Recent Labs    03/26/19 1829 03/27/19 0600  NA 128* 129*  K 3.3* 3.2*  CL 93* 98  CO2 23 20*  GLUCOSE 108* 91  BUN 14 10  CREATININE 0.90 0.83  CALCIUM 8.6* 7.9*   PT/INR No results for input(s): LABPROT, INR in the last 72 hours. CMP     Component Value Date/Time   NA 129 (L) 03/27/2019 0600   K 3.2 (L) 03/27/2019 0600   CL 98 03/27/2019 0600   CO2 20 (L) 03/27/2019 0600   GLUCOSE 91 03/27/2019 0600   BUN 10 03/27/2019 0600   CREATININE 0.83 03/27/2019 0600   CALCIUM 7.9 (L) 03/27/2019 0600   PROT 5.9 (L) 03/27/2019 0600   ALBUMIN 2.7 (L) 03/27/2019 0600   AST 13 (L) 03/27/2019 0600   ALT 12 03/27/2019 0600   ALKPHOS 54 03/27/2019 0600   BILITOT 1.2 03/27/2019 0600    GFRNONAA >60 03/27/2019 0600   GFRAA >60 03/27/2019 0600   Lipase     Component Value Date/Time   LIPASE 21 03/26/2019 1829       Studies/Results: Ct Abdomen Pelvis W Contrast  Result Date: 03/26/2019 CLINICAL DATA:  Abdominal pain with diverticulitis suspected. EXAM: CT ABDOMEN AND PELVIS WITH CONTRAST TECHNIQUE: Multidetector CT imaging of the abdomen and pelvis was performed using the standard protocol following bolus administration of intravenous contrast. CONTRAST:  127mL OMNIPAQUE IOHEXOL 300 MG/ML  SOLN COMPARISON:  None. FINDINGS: Lower chest: There is atelectasis at the lung bases.The heart size is normal. Hepatobiliary: There is decreased hepatic attenuation suggestive of hepatic steatosis. Normal gallbladder.There is no biliary ductal dilation. Pancreas: Normal contours without ductal dilatation. No peripancreatic fluid collection. Spleen: No splenic laceration or hematoma. Adrenals/Urinary Tract: --Adrenal glands: No adrenal hemorrhage. --Right kidney/ureter: No hydronephrosis or perinephric hematoma. --Left kidney/ureter: No hydronephrosis or perinephric hematoma. --Urinary bladder: There is some asymmetric bladder wall thickening which is presumably reactive. Stomach/Bowel: --Stomach/Duodenum: No hiatal hernia or other gastric abnormality. Normal duodenal course and caliber. --Small bowel: No dilatation or inflammation. --Colon: There is sigmoid diverticulitis. There is an adjacent fluid collection measuring 4.1 x 2.1 cm. There are scattered colonic diverticula. --Appendix: Not visualized. No right lower quadrant inflammation or free fluid. Vascular/Lymphatic: Normal course and caliber of  the major abdominal vessels. There is a circumaortic left renal vein, a normal variant. --No retroperitoneal lymphadenopathy. --No mesenteric lymphadenopathy. --No pelvic or inguinal lymphadenopathy. Reproductive: Unremarkable Other: There is a trace amount of extraluminal free air. There is reactive  free fluid in the left pericolic gutter. There are bilateral fat containing inguinal hernias. Musculoskeletal. No acute displaced fractures. IMPRESSION: 1. Findings consistent with sigmoid diverticulitis. There is an adjacent 4.1 x 2.1 cm fluid collection which may represent a collection of reactive free fluid. A developing abscess is not excluded. There is a trace amount of extraluminal free air adjacent to this fluid collection, best visualized on the coronal view. 2. Hepatic steatosis. 3. Asymmetric bladder wall thickening, presumably reactive. Electronically Signed   By: Constance Holster M.D.   On: 03/26/2019 21:02   Dg Chest Port 1 View  Result Date: 03/26/2019 CLINICAL DATA:  Fever EXAM: PORTABLE CHEST 1 VIEW COMPARISON:  None. FINDINGS: This is a mildly low volume film. The cardiomediastinal silhouette is unremarkable. Peribronchial thickening noted. There is no evidence of focal airspace disease, pulmonary edema, suspicious pulmonary nodule/mass, pleural effusion, or pneumothorax. No acute bony abnormalities are identified. IMPRESSION: Peribronchial thickening without evidence of focal airspace disease. Electronically Signed   By: Margarette Canada M.D.   On: 03/26/2019 19:44    Anti-infectives: Anti-infectives (From admission, onward)   Start     Dose/Rate Route Frequency Ordered Stop   03/27/19 0830  vancomycin (VANCOCIN) 1,250 mg in sodium chloride 0.9 % 250 mL IVPB  Status:  Discontinued     1,250 mg 166.7 mL/hr over 90 Minutes Intravenous Every 12 hours 03/26/19 1922 03/27/19 0739   03/27/19 0800  piperacillin-tazobactam (ZOSYN) IVPB 3.375 g     3.375 g 12.5 mL/hr over 240 Minutes Intravenous Every 8 hours 03/27/19 0750     03/27/19 0600  metroNIDAZOLE (FLAGYL) IVPB 500 mg  Status:  Discontinued     500 mg 100 mL/hr over 60 Minutes Intravenous Every 8 hours 03/26/19 2313 03/27/19 0739   03/27/19 0330  ceFEPIme (MAXIPIME) 2 g in sodium chloride 0.9 % 100 mL IVPB  Status:  Discontinued      2 g 200 mL/hr over 30 Minutes Intravenous Every 8 hours 03/26/19 1922 03/27/19 0739   03/26/19 1930  vancomycin (VANCOCIN) 2,000 mg in sodium chloride 0.9 % 500 mL IVPB     2,000 mg 250 mL/hr over 120 Minutes Intravenous  Once 03/26/19 1919 03/26/19 2151   03/26/19 1915  ceFEPIme (MAXIPIME) 2 g in sodium chloride 0.9 % 100 mL IVPB     2 g 200 mL/hr over 30 Minutes Intravenous  Once 03/26/19 1910 03/26/19 2026   03/26/19 1915  metroNIDAZOLE (FLAGYL) IVPB 500 mg     500 mg 100 mL/hr over 60 Minutes Intravenous  Once 03/26/19 1910 03/26/19 2101   03/26/19 1915  vancomycin (VANCOCIN) IVPB 1000 mg/200 mL premix  Status:  Discontinued     1,000 mg 200 mL/hr over 60 Minutes Intravenous  Once 03/26/19 1910 03/26/19 1919       Assessment/Plan HTN Depression Tobacco use Mild protein calorie malnutrition Hypokalemia - K 3.2, replace IV  Sigmoid diverticulitis with microperforation and adjacent fluid collection - collection not amenable to percutaneous drainage - still tender in the LLQ but no peritonitis - WBC 19 from 20 on admit, Tmax in 24h 103 - ice chips  - continue IV abx - if patient able to make it through this admission without surgery, will need colonoscopy in 6-8 weeks  FEN: ice chips, IVF VTE: SCDs, lovenox ID: [id number] 8/30>>  LOS: 1 day    Brigid Re , [facility] Surgery 03/27/2019, 8:44 AM Pager: [phone] Consults: [phone] 7:00 AM - 4:30 PM M, W-F 7:00 AM - 11:30 AM Tues, Sat, Sun

## 2019-03-27 NOTE — Progress Notes (Signed)
Pharmacy Antibiotic Note  Victor Boyer is a 53 y.o. male admitted on 03/26/2019 with intra-abdominal infection.  Switching from Vancomycin/Cefepime to Zosyn. WBC elevated. Renal function good.   Plan: Vancomycin and Cefepime have been DC'd Start Zosyn 3.375G IV q8h to be infused over 4 hours Trend WBC, temp, renal function  F/U infectious work-up  Height: 5\' 10"  (177.8 cm) Weight: 197 lb 8 oz (89.6 kg) IBW/kg (Calculated) : 73  Temp (24hrs), Avg:100.5 F (38.1 C), Min:99.5 F (37.5 C), Max:103.3 F (39.6 C)  Recent Labs  Lab 03/26/19 1829 03/26/19 1835 03/27/19 0600  WBC 20.9*  --  19.5*  CREATININE 0.90  --  0.83  LATICACIDVEN  --  0.9  --     Estimated Creatinine Clearance: 118.5 mL/min (by C-G formula based on SCr of 0.83 mg/dL).    No Known Allergies  Narda Bonds, PharmD, BCPS Clinical Pharmacist Phone: [phone]

## 2019-03-28 LAB — BASIC METABOLIC PANEL
Anion gap: 12 (ref 5–15)
BUN: 11 mg/dL (ref 6–20)
CO2: 19 mmol/L — ABNORMAL LOW (ref 22–32)
Calcium: 8.5 mg/dL — ABNORMAL LOW (ref 8.9–10.3)
Chloride: 103 mmol/L (ref 98–111)
Creatinine, Ser: 0.78 mg/dL (ref 0.61–1.24)
GFR calc Af Amer: >60 mL/min (ref >60)
GFR calc non Af Amer: >60 mL/min (ref >60)
Glucose, Bld: 64 mg/dL — ABNORMAL LOW (ref 70–99)
Potassium: 3.3 mmol/L — ABNORMAL LOW (ref 3.5–5.1)
Sodium: 134 mmol/L — ABNORMAL LOW (ref 135–145)

## 2019-03-28 LAB — CBC WITH DIFFERENTIAL/PLATELET
Abs Immature Granulocytes: 0.07 10*3/uL (ref 0.00–0.07)
Basophils Absolute: 0.1 10*3/uL (ref 0.0–0.1)
Basophils Relative: 0 %
Eosinophils Absolute: 0.1 10*3/uL (ref 0.0–0.5)
Eosinophils Relative: 1 %
HCT: 43.2 % (ref 39.0–52.0)
Hemoglobin: 14.2 g/dL (ref 13.0–17.0)
Immature Granulocytes: 1 %
Lymphocytes Relative: 9 %
Lymphs Abs: 1.3 10*3/uL (ref 0.7–4.0)
MCH: 30.8 pg (ref 26.0–34.0)
MCHC: 32.9 g/dL (ref 30.0–36.0)
MCV: 93.7 fL (ref 80.0–100.0)
Monocytes Absolute: 1.4 10*3/uL — ABNORMAL HIGH (ref 0.1–1.0)
Monocytes Relative: 10 %
Neutro Abs: 11 10*3/uL — ABNORMAL HIGH (ref 1.7–7.7)
Neutrophils Relative %: 79 %
Platelets: 263 10*3/uL (ref 150–400)
RBC: 4.61 MIL/uL (ref 4.22–5.81)
RDW: 14.5 % (ref 11.5–15.5)
WBC: 13.9 10*3/uL — ABNORMAL HIGH (ref 4.0–10.5)
nRBC: 0 % (ref 0.0–0.2)

## 2019-03-28 LAB — MAGNESIUM: Magnesium: 1.9 mg/dL (ref 1.7–2.4)

## 2019-03-28 MED ORDER — KCL IN DEXTROSE-NACL 40-5-0.45 MEQ/L-%-% IV SOLN
INTRAVENOUS | Status: DC
  Administered 2019-03-28 – 2019-03-30 (×4): via INTRAVENOUS
  Filled 2019-03-28 (×6): qty 1000

## 2019-03-28 MED ORDER — POTASSIUM CHLORIDE 10 MEQ/100ML IV SOLN
10.0000 meq | INTRAVENOUS | Status: DC
  Filled 2019-03-28 (×2): qty 100

## 2019-03-28 MED ORDER — SODIUM CHLORIDE 0.9 % IV SOLN
INTRAVENOUS | Status: DC | PRN
  Administered 2019-03-28: 1000 mL via INTRAVENOUS

## 2019-03-28 MED ORDER — OXYCODONE HCL 5 MG PO TABS
5.0000 mg | ORAL_TABLET | ORAL | Status: DC | PRN
  Administered 2019-03-28 (×2): 5 mg via ORAL
  Administered 2019-04-01 – 2019-04-02 (×3): 10 mg via ORAL
  Filled 2019-03-28 (×2): qty 2
  Filled 2019-03-28: qty 1
  Filled 2019-03-28 (×4): qty 2

## 2019-03-28 MED ORDER — POTASSIUM CHLORIDE CRYS ER 20 MEQ PO TBCR
20.0000 meq | EXTENDED_RELEASE_TABLET | Freq: Once | ORAL | Status: AC
  Administered 2019-03-28: 20 meq via ORAL
  Filled 2019-03-28: qty 1

## 2019-03-28 MED ORDER — MORPHINE SULFATE (PF) 2 MG/ML IV SOLN: 1.0000 mg | INTRAVENOUS | Status: DC | PRN

## 2019-03-28 NOTE — Progress Notes (Signed)
[facility] Progress Note     Subjective: CC: abdominal pain Patient reports abdominal pain is unchanged. Denies nausea. Still having diarrhea. IV potassium burned yesterday. Having some headaches which he attributes to feeling hungry.   Objective: Vital signs in last 24 hours: Temp:  [98.6 F (37 C)-99 F (37.2 C)] 99 F (37.2 C) (09/01 0515) Pulse Rate:  [84-87] 87 (09/01 0515) Resp:  [17-18] 18 (09/01 0515) BP: (119-132)/(72-84) 132/79 (09/01 0515) SpO2:  [91 %-92 %] 91 % (09/01 0515) Weight:  [87.6 kg] 87.6 kg (09/01 0515) Last BM Date: 03/27/19  Intake/Output from previous day: 08/31 0701 - 09/01 0700 In: 957.8 [P.O.:60; I.V.:679.8; IV Piggyback:218] Out: 800 [Urine:800] Intake/Output this shift: No intake/output data recorded.  PE: Gen:  Alert, NAD, pleasant Card:  Regular rate and rhythm Pulm:  Normal effort, clear to auscultation bilaterally Abd: Soft, TTP in LLQ and suprapubic, no guarding, no rebound tenderness, non-distended, +BS Skin: warm and dry, no rashes  Psych: A&Ox3   Lab Results:  Recent Labs    03/27/19 0600 03/28/19 0357  WBC 19.5* 13.9*  HGB 13.1 14.2  HCT 39.0 43.2  PLT 265 263   BMET Recent Labs    03/27/19 0600 03/28/19 0357  NA 129* 134*  K 3.2* 3.3*  CL 98 103  CO2 20* 19*  GLUCOSE 91 64*  BUN 10 11  CREATININE 0.83 0.78  CALCIUM 7.9* 8.5*   PT/INR No results for input(s): LABPROT, INR in the last 72 hours. CMP     Component Value Date/Time   NA 134 (L) 03/28/2019 0357   K 3.3 (L) 03/28/2019 0357   CL 103 03/28/2019 0357   CO2 19 (L) 03/28/2019 0357   GLUCOSE 64 (L) 03/28/2019 0357   BUN 11 03/28/2019 0357   CREATININE 0.78 03/28/2019 0357   CALCIUM 8.5 (L) 03/28/2019 0357   PROT 5.9 (L) 03/27/2019 0600   ALBUMIN 2.7 (L) 03/27/2019 0600   AST 13 (L) 03/27/2019 0600   ALT 12 03/27/2019 0600   ALKPHOS 54 03/27/2019 0600   BILITOT 1.2 03/27/2019 0600   GFRNONAA >60 03/28/2019 0357   GFRAA >60  03/28/2019 0357   Lipase     Component Value Date/Time   LIPASE 21 03/26/2019 1829       Studies/Results: Ct Abdomen Pelvis W Contrast  Result Date: 03/26/2019 CLINICAL DATA:  Abdominal pain with diverticulitis suspected. EXAM: CT ABDOMEN AND PELVIS WITH CONTRAST TECHNIQUE: Multidetector CT imaging of the abdomen and pelvis was performed using the standard protocol following bolus administration of intravenous contrast. CONTRAST:  17mL OMNIPAQUE IOHEXOL 300 MG/ML  SOLN COMPARISON:  None. FINDINGS: Lower chest: There is atelectasis at the lung bases.The heart size is normal. Hepatobiliary: There is decreased hepatic attenuation suggestive of hepatic steatosis. Normal gallbladder.There is no biliary ductal dilation. Pancreas: Normal contours without ductal dilatation. No peripancreatic fluid collection. Spleen: No splenic laceration or hematoma. Adrenals/Urinary Tract: --Adrenal glands: No adrenal hemorrhage. --Right kidney/ureter: No hydronephrosis or perinephric hematoma. --Left kidney/ureter: No hydronephrosis or perinephric hematoma. --Urinary bladder: There is some asymmetric bladder wall thickening which is presumably reactive. Stomach/Bowel: --Stomach/Duodenum: No hiatal hernia or other gastric abnormality. Normal duodenal course and caliber. --Small bowel: No dilatation or inflammation. --Colon: There is sigmoid diverticulitis. There is an adjacent fluid collection measuring 4.1 x 2.1 cm. There are scattered colonic diverticula. --Appendix: Not visualized. No right lower quadrant inflammation or free fluid. Vascular/Lymphatic: Normal course and caliber of the major abdominal vessels. There is a circumaortic left renal  vein, a normal variant. --No retroperitoneal lymphadenopathy. --No mesenteric lymphadenopathy. --No pelvic or inguinal lymphadenopathy. Reproductive: Unremarkable Other: There is a trace amount of extraluminal free air. There is reactive free fluid in the left pericolic gutter.  There are bilateral fat containing inguinal hernias. Musculoskeletal. No acute displaced fractures. IMPRESSION: 1. Findings consistent with sigmoid diverticulitis. There is an adjacent 4.1 x 2.1 cm fluid collection which may represent a collection of reactive free fluid. A developing abscess is not excluded. There is a trace amount of extraluminal free air adjacent to this fluid collection, best visualized on the coronal view. 2. Hepatic steatosis. 3. Asymmetric bladder wall thickening, presumably reactive. Electronically Signed   By: Constance Holster M.D.   On: 03/26/2019 21:02   Dg Chest Port 1 View  Result Date: 03/26/2019 CLINICAL DATA:  Fever EXAM: PORTABLE CHEST 1 VIEW COMPARISON:  None. FINDINGS: This is a mildly low volume film. The cardiomediastinal silhouette is unremarkable. Peribronchial thickening noted. There is no evidence of focal airspace disease, pulmonary edema, suspicious pulmonary nodule/mass, pleural effusion, or pneumothorax. No acute bony abnormalities are identified. IMPRESSION: Peribronchial thickening without evidence of focal airspace disease. Electronically Signed   By: Margarette Canada M.D.   On: 03/26/2019 19:44    Anti-infectives: Anti-infectives (From admission, onward)   Start     Dose/Rate Route Frequency Ordered Stop   03/27/19 0830  vancomycin (VANCOCIN) 1,250 mg in sodium chloride 0.9 % 250 mL IVPB  Status:  Discontinued     1,250 mg 166.7 mL/hr over 90 Minutes Intravenous Every 12 hours 03/26/19 1922 03/27/19 0739   03/27/19 0800  piperacillin-tazobactam (ZOSYN) IVPB 3.375 g     3.375 g 12.5 mL/hr over 240 Minutes Intravenous Every 8 hours 03/27/19 0750     03/27/19 0600  metroNIDAZOLE (FLAGYL) IVPB 500 mg  Status:  Discontinued     500 mg 100 mL/hr over 60 Minutes Intravenous Every 8 hours 03/26/19 2313 03/27/19 0739   03/27/19 0330  ceFEPIme (MAXIPIME) 2 g in sodium chloride 0.9 % 100 mL IVPB  Status:  Discontinued     2 g 200 mL/hr over 30 Minutes  Intravenous Every 8 hours 03/26/19 1922 03/27/19 0739   03/26/19 1930  vancomycin (VANCOCIN) 2,000 mg in sodium chloride 0.9 % 500 mL IVPB     2,000 mg 250 mL/hr over 120 Minutes Intravenous  Once 03/26/19 1919 03/26/19 2151   03/26/19 1915  ceFEPIme (MAXIPIME) 2 g in sodium chloride 0.9 % 100 mL IVPB     2 g 200 mL/hr over 30 Minutes Intravenous  Once 03/26/19 1910 03/26/19 2026   03/26/19 1915  metroNIDAZOLE (FLAGYL) IVPB 500 mg     500 mg 100 mL/hr over 60 Minutes Intravenous  Once 03/26/19 1910 03/26/19 2101   03/26/19 1915  vancomycin (VANCOCIN) IVPB 1000 mg/200 mL premix  Status:  Discontinued     1,000 mg 200 mL/hr over 60 Minutes Intravenous  Once 03/26/19 1910 03/26/19 1919       Assessment/Plan HTN Depression Tobacco use Mild protein calorie malnutrition Hypokalemia - K 3.3, replace PO and in IVF  Sigmoid diverticulitis with microperforation and adjacent fluid collection - collection not amenable to percutaneous drainage - still tender in the LLQ but no peritonitis - WBC 13, afebrile - continue ice chips - would like to see tenderness somewhat improved prior to advancing diet - continue IV abx - if patient able to make it through this admission without surgery, will need colonoscopy in 6-8 weeks   FEN:  ice chips, IVF VTE: SCDs, lovenox ID: Zosyn 8/30>>  LOS: 2 days    Brigid Re , [facility] 03/28/2019, 8:11 AM Pager: [phone] Consults: [phone] 7:00 AM - 4:30 PM M, W-F 7:00 AM - 11:30 AM Tues, Sat, Sun

## 2019-03-28 NOTE — Progress Notes (Addendum)
PROGRESS NOTE    Alic Obremski  [id number] DOB: [birth year] DOA: 03/26/2019 PCP: Patient, No Pcp Per   Brief Narrative:  Patient is a 53 year old male with history of hypertension, hyperlipidemia, depression, anxiety who presented with abdominal pain in the left lower quadrant.  In the emergency department he was found to be febrile.  CT abdomen/pelvis showed sigmoid diverticulitis with possible microperforation and fluid collection.  General surgery consulted.  Started on conservative management.  Assessment & Plan:   Principal Problem:   Acute diverticulitis Active Problems:   Major depressive disorder, single episode, severe (HCC)   SIRS (systemic inflammatory response syndrome) (HCC)   Essential hypertension   Sigmoid diverticulitis: CT finding as above.  Suspected microperforation with possible abscess but not amenable for drainage.  General surgery following.  Continue pain management.  N.p.o.  Continue Zosyn.  Continue IV fluids He had  severe leukocytosis but is improving.  Currently afebrile.Cultures no growth till date. He needs colonoscopy as an outpatient in 6 to 8 weeks.  Never had a colonoscopy in the past.  Hypertension: Currently blood pressure stable.  Continue to monitor blood pressure.  Continue PRN meds  Hyperlipidemia: On fenofibrate .  History of anxiety/depression: On Celexa, BuSpar and trazodone at home.  Hypokalemia: Being supplemented with potassium.  Magnesium level normal.           DVT prophylaxis: Lovenox Code Status: Full Family Communication: None present at the bedside.  Discussed the treatment plan with the patient Disposition Plan: Home after resolution of abdominal pain, diet tolerance   Consultants: general surgery  Procedures: None  Antimicrobials:  Anti-infectives (From admission, onward)   Start     Dose/Rate Route Frequency Ordered Stop   03/27/19 0830  vancomycin (VANCOCIN) 1,250 mg in sodium chloride 0.9 % 250 mL IVPB   Status:  Discontinued     1,250 mg 166.7 mL/hr over 90 Minutes Intravenous Every 12 hours 03/26/19 1922 03/27/19 0739   03/27/19 0800  piperacillin-tazobactam (ZOSYN) IVPB 3.375 g     3.375 g 12.5 mL/hr over 240 Minutes Intravenous Every 8 hours 03/27/19 0750     03/27/19 0600  metroNIDAZOLE (FLAGYL) IVPB 500 mg  Status:  Discontinued     500 mg 100 mL/hr over 60 Minutes Intravenous Every 8 hours 03/26/19 2313 03/27/19 0739   03/27/19 0330  ceFEPIme (MAXIPIME) 2 g in sodium chloride 0.9 % 100 mL IVPB  Status:  Discontinued     2 g 200 mL/hr over 30 Minutes Intravenous Every 8 hours 03/26/19 1922 03/27/19 0739   03/26/19 1930  vancomycin (VANCOCIN) 2,000 mg in sodium chloride 0.9 % 500 mL IVPB     2,000 mg 250 mL/hr over 120 Minutes Intravenous  Once 03/26/19 1919 03/26/19 2151   03/26/19 1915  ceFEPIme (MAXIPIME) 2 g in sodium chloride 0.9 % 100 mL IVPB     2 g 200 mL/hr over 30 Minutes Intravenous  Once 03/26/19 1910 03/26/19 2026   03/26/19 1915  metroNIDAZOLE (FLAGYL) IVPB 500 mg     500 mg 100 mL/hr over 60 Minutes Intravenous  Once 03/26/19 1910 03/26/19 2101   03/26/19 1915  vancomycin (VANCOCIN) IVPB 1000 mg/200 mL premix  Status:  Discontinued     1,000 mg 200 mL/hr over 60 Minutes Intravenous  Once 03/26/19 1910 03/26/19 1919      Subjective:  Patient seen and examined the bedside this morning.  Hemodynamically stable.  Abdominal pain almost same as yesterday ,may be slighlty  bit better.  Denies  any abdominal pain or vomiting.  Afebrile.  Objective: Vitals:   03/27/19 1311 03/27/19 1934 03/28/19 0515 03/28/19 0827  BP: 119/78 123/84 132/79 132/79  Pulse: 84 84 87 85  Resp: 17 18 18    Temp: 98.8 F (37.1 C) 98.6 F (37 C) 99 F (37.2 C)   TempSrc: Oral Oral Oral   SpO2: 91% 92% 91%   Weight:   87.6 kg   Height:        Intake/Output Summary (Last 24 hours) at 03/28/2019 1043 Last data filed at 03/28/2019 0834 Gross per 24 hour  Intake 957.78 ml  Output 800 ml   Net 157.78 ml   Filed Weights   03/26/19 1807 03/27/19 0538 03/28/19 0515  Weight: 86.2 kg 89.6 kg 87.6 kg    Examination:   General exam: Not in distress,average built HEENT:PERRL,Oral mucosa moist, Ear/Nose normal on gross exam Respiratory system: Bilateral equal air entry, normal vesicular breath sounds, no wheezes or crackles  Cardiovascular system: S1 & S2 heard, RRR. No JVD, murmurs, rubs, gallops or clicks. Gastrointestinal system: Abdomen is nondistended, soft.tenderness on the left lower quadrant.   Central nervous system: Alert and oriented. No focal neurological deficits. Extremities: No edema, no clubbing ,no cyanosis, distal peripheral pulses palpable. Skin: No rashes, lesions or ulcers,no icterus ,no pallor   Data Reviewed: I have personally reviewed following labs and imaging studies  CBC: Recent Labs  Lab 03/26/19 1829 03/27/19 0600 03/28/19 0357  WBC 20.9* 19.5* 13.9*  NEUTROABS  --  15.8* 11.0*  HGB 15.0 13.1 14.2  HCT 43.5 39.0 43.2  MCV 91.0 91.8 93.7  PLT 302 265 [id number]   Basic Metabolic Panel: Recent Labs  Lab 03/26/19 1829 03/27/19 0600 03/28/19 0357  NA 128* 129* 134*  K 3.3* 3.2* 3.3*  CL 93* 98 103  CO2 23 20* 19*  GLUCOSE 108* 91 64*  BUN 14 10 11   CREATININE 0.90 0.83 0.78  CALCIUM 8.6* 7.9* 8.5*  MG  --   --  1.9   GFR: Estimated Creatinine Clearance: 121.8 mL/min (by C-G formula based on SCr of 0.78 mg/dL). Liver Function Tests: Recent Labs  Lab 03/26/19 1829 03/27/19 0600  AST 12* 13*  ALT 13 12  ALKPHOS 57 54  BILITOT 1.0 1.2  PROT 6.6 5.9*  ALBUMIN 3.3* 2.7*   Recent Labs  Lab 03/26/19 1829  LIPASE 21   No results for input(s): AMMONIA in the last 168 hours. Coagulation Profile: No results for input(s): INR, PROTIME in the last 168 hours. Cardiac Enzymes: No results for input(s): CKTOTAL, CKMB, CKMBINDEX, TROPONINI in the last 168 hours. BNP (last 3 results) No results for input(s): PROBNP in the last 8760  hours. HbA1C: No results for input(s): HGBA1C in the last 72 hours. CBG: No results for input(s): GLUCAP in the last 168 hours. Lipid Profile: No results for input(s): CHOL, HDL, LDLCALC, TRIG, CHOLHDL, LDLDIRECT in the last 72 hours. Thyroid Function Tests: No results for input(s): TSH, T4TOTAL, FREET4, T3FREE, THYROIDAB in the last 72 hours. Anemia Panel: No results for input(s): VITAMINB12, FOLATE, FERRITIN, TIBC, IRON, RETICCTPCT in the last 72 hours. Sepsis Labs: Recent Labs  Lab 03/26/19 1835  LATICACIDVEN 0.9    Recent Results (from the past 240 hour(s))  SARS Coronavirus 2 [facility] order, Performed in [facility] hospital lab) Nasopharyngeal Nasopharyngeal Swab     Status: None   Collection Time: 03/26/19  6:57 PM   Specimen: Nasopharyngeal Swab  Result Value Ref Range Status   SARS  Coronavirus 2 NEGATIVE NEGATIVE Final    Comment: (NOTE) If result is NEGATIVE SARS-CoV-2 target nucleic acids are NOT DETECTED. The SARS-CoV-2 RNA is generally detectable in upper and lower  respiratory specimens during the acute phase of infection. The lowest  concentration of SARS-CoV-2 viral copies this assay can detect is 250  copies / mL. A negative result does not preclude SARS-CoV-2 infection  and should not be used as the sole basis for treatment or other  patient management decisions.  A negative result may occur with  improper specimen collection / handling, submission of specimen other  than nasopharyngeal swab, presence of viral mutation(s) within the  areas targeted by this assay, and inadequate number of viral copies  (<250 copies / mL). A negative result must be combined with clinical  observations, patient history, and epidemiological information. If result is POSITIVE SARS-CoV-2 target nucleic acids are DETECTED. The SARS-CoV-2 RNA is generally detectable in upper and lower  respiratory specimens dur ing the acute phase of infection.  Positive  results are indicative  of active infection with SARS-CoV-2.  Clinical  correlation with patient history and other diagnostic information is  necessary to determine patient infection status.  Positive results do  not rule out bacterial infection or co-infection with other viruses. If result is PRESUMPTIVE POSTIVE SARS-CoV-2 nucleic acids MAY BE PRESENT.   A presumptive positive result was obtained on the submitted specimen  and confirmed on repeat testing.  While 2019 novel coronavirus  (SARS-CoV-2) nucleic acids may be present in the submitted sample  additional confirmatory testing may be necessary for epidemiological  and / or clinical management purposes  to differentiate between  SARS-CoV-2 and other Sarbecovirus currently known to infect humans.  If clinically indicated additional testing with an alternate test  methodology [phone]) is advised. The SARS-CoV-2 RNA is generally  detectable in upper and lower respiratory sp ecimens during the acute  phase of infection. The expected result is Negative. Fact Sheet for Patients:  StrictlyIdeas.no Fact Sheet for Healthcare Providers: BankingDealers.co.za This test is not yet approved or cleared by the [place] FDA and has been authorized for detection and/or diagnosis of SARS-CoV-2 by FDA under an Emergency Use Authorization (EUA).  This EUA will remain in effect (meaning this test can be used) for the duration of the COVID-19 declaration under Section 564(b)(1) of the Act, 21 U.S.C. section 360bbb-3(b)(1), unless the authorization is terminated or revoked sooner. Performed at [facility], [place] [address] [place], [place] [address]   Blood Culture (routine x 2)     Status: None (Preliminary result)   Collection Time: 03/26/19  6:58 PM   Specimen: BLOOD  Result Value Ref Range Status   Specimen Description BLOOD LEFT ANTECUBITAL  Final   Special Requests   Final    BOTTLES DRAWN AEROBIC AND  ANAEROBIC Blood Culture adequate volume   Culture   Final    NO GROWTH 2 DAYS Performed at [facility], [place] [address] [place],  [address]    Report Status PENDING  Incomplete  Blood Culture (routine x 2)     Status: None (Preliminary result)   Collection Time: 03/26/19  7:46 PM   Specimen: BLOOD RIGHT FOREARM  Result Value Ref Range Status   Specimen Description BLOOD RIGHT FOREARM  Final   Special Requests   Final    BOTTLES DRAWN AEROBIC AND ANAEROBIC Blood Culture results may not be optimal due to an inadequate volume of blood received in culture  bottles   Culture   Final    NO GROWTH 2 DAYS Performed at [facility], [place] [address] [place], [place] [address]    Report Status PENDING  Incomplete  Urine culture     Status: None   Collection Time: 03/26/19  9:37 PM   Specimen: In/Out Cath Urine  Result Value Ref Range Status   Specimen Description IN/OUT CATH URINE  Final   Special Requests NONE  Final   Culture   Final    NO GROWTH Performed at [facility], [place] [address] [place], [place] [address]    Report Status 03/27/2019 FINAL  Final         Radiology Studies: Ct Abdomen Pelvis W Contrast  Result Date: 03/26/2019 CLINICAL DATA:  Abdominal pain with diverticulitis suspected. EXAM: CT ABDOMEN AND PELVIS WITH CONTRAST TECHNIQUE: Multidetector CT imaging of the abdomen and pelvis was performed using the standard protocol following bolus administration of intravenous contrast. CONTRAST:  139mL OMNIPAQUE IOHEXOL 300 MG/ML  SOLN COMPARISON:  None. FINDINGS: Lower chest: There is atelectasis at the lung bases.The heart size is normal. Hepatobiliary: There is decreased hepatic attenuation suggestive of hepatic steatosis. Normal gallbladder.There is no biliary ductal dilation. Pancreas: Normal contours without ductal dilatation. No peripancreatic fluid collection. Spleen: No splenic laceration or hematoma. Adrenals/Urinary Tract: --Adrenal glands: No  adrenal hemorrhage. --Right kidney/ureter: No hydronephrosis or perinephric hematoma. --Left kidney/ureter: No hydronephrosis or perinephric hematoma. --Urinary bladder: There is some asymmetric bladder wall thickening which is presumably reactive. Stomach/Bowel: --Stomach/Duodenum: No hiatal hernia or other gastric abnormality. Normal duodenal course and caliber. --Small bowel: No dilatation or inflammation. --Colon: There is sigmoid diverticulitis. There is an adjacent fluid collection measuring 4.1 x 2.1 cm. There are scattered colonic diverticula. --Appendix: Not visualized. No right lower quadrant inflammation or free fluid. Vascular/Lymphatic: Normal course and caliber of the major abdominal vessels. There is a circumaortic left renal vein, a normal variant. --No retroperitoneal lymphadenopathy. --No mesenteric lymphadenopathy. --No pelvic or inguinal lymphadenopathy. Reproductive: Unremarkable Other: There is a trace amount of extraluminal free air. There is reactive free fluid in the left pericolic gutter. There are bilateral fat containing inguinal hernias. Musculoskeletal. No acute displaced fractures. IMPRESSION: 1. Findings consistent with sigmoid diverticulitis. There is an adjacent 4.1 x 2.1 cm fluid collection which may represent a collection of reactive free fluid. A developing abscess is not excluded. There is a trace amount of extraluminal free air adjacent to this fluid collection, best visualized on the coronal view. 2. Hepatic steatosis. 3. Asymmetric bladder wall thickening, presumably reactive. Electronically Signed   By: Constance Holster M.D.   On: 03/26/2019 21:02   Dg Chest Port 1 View  Result Date: 03/26/2019 CLINICAL DATA:  Fever EXAM: PORTABLE CHEST 1 VIEW COMPARISON:  None. FINDINGS: This is a mildly low volume film. The cardiomediastinal silhouette is unremarkable. Peribronchial thickening noted. There is no evidence of focal airspace disease, pulmonary edema, suspicious  pulmonary nodule/mass, pleural effusion, or pneumothorax. No acute bony abnormalities are identified. IMPRESSION: Peribronchial thickening without evidence of focal airspace disease. Electronically Signed   By: Margarette Canada M.D.   On: 03/26/2019 19:44        Scheduled Meds: . busPIRone  5 mg Oral BID  . citalopram  20 mg Oral Daily  . enoxaparin (LOVENOX) injection  40 mg Subcutaneous Daily  . fenofibrate  160 mg Oral Daily  . lisinopril  20 mg Oral Daily  . traZODone  50 mg Oral QHS  Continuous Infusions: . dextrose 5 % and 0.45 % NaCl with KCl 40 mEq/L    . piperacillin-tazobactam (ZOSYN)  IV 3.375 g (03/28/19 0521)     LOS: 2 days    Time spent:25 mins. More than 50% of that time was spent in counseling and/or coordination of care.      Shelly Coss, MD [facility] Pager [phone]  If 7PM-7AM, please contact night-coverage [email or web address] Password [facility] 03/28/2019, 10:43 AM

## 2019-03-29 LAB — CBC WITH DIFFERENTIAL/PLATELET
Abs Immature Granulocytes: 0.08 10*3/uL — ABNORMAL HIGH (ref 0.00–0.07)
Basophils Absolute: 0.1 10*3/uL (ref 0.0–0.1)
Basophils Relative: 1 %
Eosinophils Absolute: 0.2 10*3/uL (ref 0.0–0.5)
Eosinophils Relative: 2 %
HCT: 39.4 % (ref 39.0–52.0)
Hemoglobin: 13.1 g/dL (ref 13.0–17.0)
Immature Granulocytes: 1 %
Lymphocytes Relative: 8 %
Lymphs Abs: 1 10*3/uL (ref 0.7–4.0)
MCH: 31 pg (ref 26.0–34.0)
MCHC: 33.2 g/dL (ref 30.0–36.0)
MCV: 93.4 fL (ref 80.0–100.0)
Monocytes Absolute: 1.4 10*3/uL — ABNORMAL HIGH (ref 0.1–1.0)
Monocytes Relative: 12 %
Neutro Abs: 9.4 10*3/uL — ABNORMAL HIGH (ref 1.7–7.7)
Neutrophils Relative %: 76 %
Platelets: 336 10*3/uL (ref 150–400)
RBC: 4.22 MIL/uL (ref 4.22–5.81)
RDW: 14.6 % (ref 11.5–15.5)
WBC: 12.2 10*3/uL — ABNORMAL HIGH (ref 4.0–10.5)
nRBC: 0 % (ref 0.0–0.2)

## 2019-03-29 LAB — BASIC METABOLIC PANEL
Anion gap: 10 (ref 5–15)
BUN: 10 mg/dL (ref 6–20)
CO2: 21 mmol/L — ABNORMAL LOW (ref 22–32)
Calcium: 8.1 mg/dL — ABNORMAL LOW (ref 8.9–10.3)
Chloride: 103 mmol/L (ref 98–111)
Creatinine, Ser: 0.69 mg/dL (ref 0.61–1.24)
GFR calc Af Amer: >60 mL/min (ref >60)
GFR calc non Af Amer: >60 mL/min (ref >60)
Glucose, Bld: 100 mg/dL — ABNORMAL HIGH (ref 70–99)
Potassium: 3.6 mmol/L (ref 3.5–5.1)
Sodium: 134 mmol/L — ABNORMAL LOW (ref 135–145)

## 2019-03-29 NOTE — Progress Notes (Signed)
Subjective/Chief Complaint: Less tender   Objective: Vital signs in last 24 hours: Temp:  [98.2 F (36.8 C)-99.1 F (37.3 C)] 98.2 F (36.8 C) (09/02 1145) Pulse Rate:  [72-86] 72 (09/02 1145) Resp:  [18-20] 20 (09/02 1145) BP: (118-142)/(77-84) 118/79 (09/02 1145) SpO2:  [89 %-91 %] 90 % (09/02 0818) Weight:  [87.6 kg] 87.6 kg (09/02 0444) Last BM Date: 03/29/19  Intake/Output from previous day: 09/01 0701 - 09/02 0700 In: 2135 [P.O.:180; I.V.:1797.5; IV Piggyback:157.5] Out: 100 [Urine:100] Intake/Output this shift: Total I/O In: 0  Out: 400 [Urine:400]  Abd - minimal LLQ tenderness  Lab Results:  Recent Labs    03/28/19 0357 03/29/19 0427  WBC 13.9* 12.2*  HGB 14.2 13.1  HCT 43.2 39.4  PLT 263 336   BMET Recent Labs    03/28/19 0357 03/29/19 0427  NA 134* 134*  K 3.3* 3.6  CL 103 103  CO2 19* 21*  GLUCOSE 64* 100*  BUN 11 10  CREATININE 0.78 0.69  CALCIUM 8.5* 8.1*   PT/INR No results for input(s): LABPROT, INR in the last 72 hours. ABG No results for input(s): PHART, HCO3 in the last 72 hours.  Invalid input(s): PCO2, PO2  Studies/Results: No results found.  Anti-infectives: Anti-infectives (From admission, onward)   Start     Dose/Rate Route Frequency Ordered Stop   03/27/19 0830  vancomycin (VANCOCIN) 1,250 mg in sodium chloride 0.9 % 250 mL IVPB  Status:  Discontinued     1,250 mg 166.7 mL/hr over 90 Minutes Intravenous Every 12 hours 03/26/19 1922 03/27/19 0739   03/27/19 0800  piperacillin-tazobactam (ZOSYN) IVPB 3.375 g     3.375 g 12.5 mL/hr over 240 Minutes Intravenous Every 8 hours 03/27/19 0750     03/27/19 0600  metroNIDAZOLE (FLAGYL) IVPB 500 mg  Status:  Discontinued     500 mg 100 mL/hr over 60 Minutes Intravenous Every 8 hours 03/26/19 2313 03/27/19 0739   03/27/19 0330  ceFEPIme (MAXIPIME) 2 g in sodium chloride 0.9 % 100 mL IVPB  Status:  Discontinued     2 g 200 mL/hr over 30 Minutes Intravenous Every 8 hours  03/26/19 1922 03/27/19 0739   03/26/19 1930  vancomycin (VANCOCIN) 2,000 mg in sodium chloride 0.9 % 500 mL IVPB     2,000 mg 250 mL/hr over 120 Minutes Intravenous  Once 03/26/19 1919 03/26/19 2151   03/26/19 1915  ceFEPIme (MAXIPIME) 2 g in sodium chloride 0.9 % 100 mL IVPB     2 g 200 mL/hr over 30 Minutes Intravenous  Once 03/26/19 1910 03/26/19 2026   03/26/19 1915  metroNIDAZOLE (FLAGYL) IVPB 500 mg     500 mg 100 mL/hr over 60 Minutes Intravenous  Once 03/26/19 1910 03/26/19 2101   03/26/19 1915  vancomycin (VANCOCIN) IVPB 1000 mg/200 mL premix  Status:  Discontinued     1,000 mg 200 mL/hr over 60 Minutes Intravenous  Once 03/26/19 1910 03/26/19 1919      Assessment/Plan: HTN Depression Tobacco use Mild protein calorie malnutrition Hypokalemia- K 3.3, replace PO and in IVF  Sigmoid diverticulitis with microperforation and adjacent fluid collection - collection not amenable to percutaneous drainage - still tender in the LLQ but no peritonitis - WBC down to 12, afebrile - clear liquids - continue IV abx - if patient able to make it through this admission without surgery, will need colonoscopy in 6-8 weeks   FEN: clear liquids, IVF VTE: SCDs, lovenox ID: Zosyn 8/30>>  LOS: 3 days  Imogene Burn Georgette Dover 03/29/2019

## 2019-03-29 NOTE — Progress Notes (Signed)
PROGRESS NOTE    Victor Boyer  [id number] DOB: Mar 16, 1966 DOA: 03/26/2019 PCP: Patient, No Pcp Per    Brief Narrative:   Victor Boyer is a 53 year old male with history of hypertension, hyperlipidemia, depression, anxiety who presented with abdominal pain in the left lower quadrant.  In the emergency department he was found to be febrile.  CT abdomen/pelvis showed sigmoid diverticulitis with possible microperforation and fluid collection.  General surgery consulted.  Started on conservative management.   Assessment & Plan:   Principal Problem:   Acute diverticulitis Active Problems:   Major depressive disorder, single episode, severe (HCC)   SIRS (systemic inflammatory response syndrome) (HCC)   Essential hypertension   Sigmoid diverticulitis:  Patient presenting with progressive left lower quadrant pain.  CT abdomen/pelvis notable for sigmoid diverticulitis with possible microperforation and fluid collection.  IR consulted, no significant fluid collection for aspiration/drainage.  General surgery consulted and recommended conservative management with IV antibiotics and pain control. --WBC 19.5-->13.9-->12.2 --Urine culture: No growth --Blood cultures x2: No growth x2 days --Continue Zosyn --Pain control with oxycodone IR prn --Surgery advancing diet from NPO to clears today --Continue IV fluids with D5 1/2 NS with 85 M EQ KCL until tolerating diet --Continue to monitor serial abdominal exams and bowel movements --Will need colonoscopy outpatient in 6-8 weeks; no prior colonoscopies in the past  Hypertension:  Currently blood pressure stable.   --Lisinopril 20 mg p.o. daily Continue to monitor blood pressure.  Continue PRN meds  Hyperlipidemia: continue fenofibrate .  History of anxiety/depression: Continue home Celexa, BuSpar and trazodone at home.  Hypokalemia:  Repleted --Continue to monitor electrolytes to include magnesium daily   DVT prophylaxis: Lovenox  Code Status: Full code Family Communication: None Disposition Plan: Anticipate discharge home when cleared by general surgery and tolerating diet   Consultants:   General surgery  Procedures:   None  Antimicrobials:   Zosyn   Subjective: Patient seen and examined at bedside, resting comfortably.  States abdominal discomfort is much improved, continues with mild pain in the left lower quadrant to low midline.  Seen by general surgery this morning, advancing diet to clears.  Reports watery bowel movement yesterday.  No other complaints or concerns at this time.  Denies headache, no fever/chills/night sweats, no nausea vomiting, no chest pain, no palpitations, no weakness, no issues with bladder function, no paresthesias.  No acute events overnight per nursing staff.  Objective: Vitals:   03/28/19 1153 03/28/19 1922 03/29/19 0444 03/29/19 0818  BP: 137/79 (!) 142/84 124/77 131/82  Pulse: 83 86 82 80  Resp: 20  18 20   Temp: 98.9 F (37.2 C) 98.9 F (37.2 C) 99.1 F (37.3 C) 98.4 F (36.9 C)  TempSrc: Oral Oral Oral Oral  SpO2: 91% (!) 89% 91% 90%  Weight:   87.6 kg   Height:        Intake/Output Summary (Last 24 hours) at 03/29/2019 1131 Last data filed at 03/29/2019 0900 Gross per 24 hour  Intake 2075.02 ml  Output 100 ml  Net 1975.02 ml   Filed Weights   03/27/19 0538 03/28/19 0515 03/29/19 0444  Weight: 89.6 kg 87.6 kg 87.6 kg    Examination:  General exam: Appears calm and comfortable  Respiratory system: Clear to auscultation. Respiratory effort normal. Cardiovascular system: S1 & S2 heard, RRR. No JVD, murmurs, rubs, gallops or clicks. No pedal edema. Gastrointestinal system: Abdomen is nondistended, soft, mild left lower quadrant tenderness, No organomegaly or masses felt. Normal bowel sounds heard. Central  nervous system: Alert and oriented. No focal neurological deficits. Extremities: Symmetric 5 x 5 power. Skin: No rashes, lesions or ulcers Psychiatry:  Judgement and insight appear normal. Mood & affect appropriate.     Data Reviewed: I have personally reviewed following labs and imaging studies  CBC: Recent Labs  Lab 03/26/19 1829 03/27/19 0600 03/28/19 0357 03/29/19 0427  WBC 20.9* 19.5* 13.9* 12.2*  NEUTROABS  --  15.8* 11.0* 9.4*  HGB 15.0 13.1 14.2 13.1  HCT 43.5 39.0 43.2 39.4  MCV 91.0 91.8 93.7 93.4  PLT 302 265 263 [id number]   Basic Metabolic Panel: Recent Labs  Lab 03/26/19 1829 03/27/19 0600 03/28/19 0357 03/29/19 0427  NA 128* 129* 134* 134*  K 3.3* 3.2* 3.3* 3.6  CL 93* 98 103 103  CO2 23 20* 19* 21*  GLUCOSE 108* 91 64* 100*  BUN 14 10 11 10   CREATININE 0.90 0.83 0.78 0.69  CALCIUM 8.6* 7.9* 8.5* 8.1*  MG  --   --  1.9  --    GFR: Estimated Creatinine Clearance: 121.8 mL/min (by C-G formula based on SCr of 0.69 mg/dL). Liver Function Tests: Recent Labs  Lab 03/26/19 1829 03/27/19 0600  AST 12* 13*  ALT 13 12  ALKPHOS 57 54  BILITOT 1.0 1.2  PROT 6.6 5.9*  ALBUMIN 3.3* 2.7*   Recent Labs  Lab 03/26/19 1829  LIPASE 21   No results for input(s): AMMONIA in the last 168 hours. Coagulation Profile: No results for input(s): INR, PROTIME in the last 168 hours. Cardiac Enzymes: No results for input(s): CKTOTAL, CKMB, CKMBINDEX, TROPONINI in the last 168 hours. BNP (last 3 results) No results for input(s): PROBNP in the last 8760 hours. HbA1C: No results for input(s): HGBA1C in the last 72 hours. CBG: No results for input(s): GLUCAP in the last 168 hours. Lipid Profile: No results for input(s): CHOL, HDL, LDLCALC, TRIG, CHOLHDL, LDLDIRECT in the last 72 hours. Thyroid Function Tests: No results for input(s): TSH, T4TOTAL, FREET4, T3FREE, THYROIDAB in the last 72 hours. Anemia Panel: No results for input(s): VITAMINB12, FOLATE, FERRITIN, TIBC, IRON, RETICCTPCT in the last 72 hours. Sepsis Labs: Recent Labs  Lab 03/26/19 1835  LATICACIDVEN 0.9    Recent Results (from the past 240 hour(s))   SARS Coronavirus 2 [facility] order, Performed in [facility] hospital lab) Nasopharyngeal Nasopharyngeal Swab     Status: None   Collection Time: 03/26/19  6:57 PM   Specimen: Nasopharyngeal Swab  Result Value Ref Range Status   SARS Coronavirus 2 NEGATIVE NEGATIVE Final    Comment: (NOTE) If result is NEGATIVE SARS-CoV-2 target nucleic acids are NOT DETECTED. The SARS-CoV-2 RNA is generally detectable in upper and lower  respiratory specimens during the acute phase of infection. The lowest  concentration of SARS-CoV-2 viral copies this assay can detect is 250  copies / mL. A negative result does not preclude SARS-CoV-2 infection  and should not be used as the sole basis for treatment or other  patient management decisions.  A negative result may occur with  improper specimen collection / handling, submission of specimen other  than nasopharyngeal swab, presence of viral mutation(s) within the  areas targeted by this assay, and inadequate number of viral copies  (<250 copies / mL). A negative result must be combined with clinical  observations, patient history, and epidemiological information. If result is POSITIVE SARS-CoV-2 target nucleic acids are DETECTED. The SARS-CoV-2 RNA is generally detectable in upper and lower  respiratory specimens dur ing the  acute phase of infection.  Positive  results are indicative of active infection with SARS-CoV-2.  Clinical  correlation with patient history and other diagnostic information is  necessary to determine patient infection status.  Positive results do  not rule out bacterial infection or co-infection with other viruses. If result is PRESUMPTIVE POSTIVE SARS-CoV-2 nucleic acids MAY BE PRESENT.   A presumptive positive result was obtained on the submitted specimen  and confirmed on repeat testing.  While 2019 novel coronavirus  (SARS-CoV-2) nucleic acids may be present in the submitted sample  additional confirmatory testing may be  necessary for epidemiological  and / or clinical management purposes  to differentiate between  SARS-CoV-2 and other Sarbecovirus currently known to infect humans.  If clinically indicated additional testing with an alternate test  methodology [phone]) is advised. The SARS-CoV-2 RNA is generally  detectable in upper and lower respiratory sp ecimens during the acute  phase of infection. The expected result is Negative. Fact Sheet for Patients:  StrictlyIdeas.no Fact Sheet for Healthcare Providers: BankingDealers.co.za This test is not yet approved or cleared by the [place] FDA and has been authorized for detection and/or diagnosis of SARS-CoV-2 by FDA under an Emergency Use Authorization (EUA).  This EUA will remain in effect (meaning this test can be used) for the duration of the COVID-19 declaration under Section 564(b)(1) of the Act, 21 U.S.C. section 360bbb-3(b)(1), unless the authorization is terminated or revoked sooner. Performed at [facility], [place] [address], [place], [place] [address]   Blood Culture (routine x 2)     Status: None (Preliminary result)   Collection Time: 03/26/19  6:58 PM   Specimen: BLOOD  Result Value Ref Range Status   Specimen Description BLOOD LEFT ANTECUBITAL  Final   Special Requests   Final    BOTTLES DRAWN AEROBIC AND ANAEROBIC Blood Culture adequate volume   Culture   Final    NO GROWTH 2 DAYS Performed at [facility], [place] [address], [place], [place] [address]    Report Status PENDING  Incomplete  Blood Culture (routine x 2)     Status: None (Preliminary result)   Collection Time: 03/26/19  7:46 PM   Specimen: BLOOD RIGHT FOREARM  Result Value Ref Range Status   Specimen Description BLOOD RIGHT FOREARM  Final   Special Requests   Final    BOTTLES DRAWN AEROBIC AND ANAEROBIC Blood Culture results may not be optimal due to an inadequate volume of blood received in culture  bottles   Culture   Final    NO GROWTH 2 DAYS Performed at [facility], [place] [address] [place], [place] [address]    Report Status PENDING  Incomplete  Urine culture     Status: None   Collection Time: 03/26/19  9:37 PM   Specimen: In/Out Cath Urine  Result Value Ref Range Status   Specimen Description IN/OUT CATH URINE  Final   Special Requests NONE  Final   Culture   Final    NO GROWTH Performed at [facility], [place] [address] [place],  [address]    Report Status 03/27/2019 FINAL  Final         Radiology Studies: No results found.      Scheduled Meds: . busPIRone  5 mg Oral BID  . citalopram  20 mg Oral Daily  . enoxaparin (LOVENOX) injection  40 mg Subcutaneous Daily  . fenofibrate  160 mg Oral Daily  . lisinopril  20 mg Oral Daily  . traZODone  50 mg Oral QHS   Continuous Infusions: . sodium chloride 1,000 mL (03/28/19 2256)  . dextrose 5 % and 0.45 % NaCl with KCl 40 mEq/L 100 mL/hr at 03/29/19 0058  . piperacillin-tazobactam (ZOSYN)  IV 3.375 g (03/29/19 0520)     LOS: 3 days    Time spent: 36 minutes spent on chart review, discussion with nursing staff, personally reviewing all imaging studies and labs, consultants, updating family and interview/physical exam; more than 50% of that time was spent in counseling and/or coordination of care.    Woodie Trusty J [place] DO [facility] Pager [phone]  If 7PM-7AM, please contact night-coverage [email or web address] Password [facility] 03/29/2019, 11:31 AM

## 2019-03-29 NOTE — TOC Progression Note (Signed)
Transition of Care [facility]) - Progression Note    Patient Details  Name: Victor Boyer MRN: [id number] Date of Birth: October 20, 1965  Transition of Care [facility]) CM/SW Contact  Zenon Mayo, RN Phone Number: 03/29/2019, 7:20 AM  Clinical Narrative:    From home with spouse, sigmoid diverticulitis, TOC team will continue to follow for [facility]  Needs.        Expected Discharge Plan and Services           Expected Discharge Date: 03/28/19                                     Social Determinants of Health (SDOH) Interventions    Readmission Risk Interventions No flowsheet data found.

## 2019-03-30 LAB — CBC
HCT: 41.2 % (ref 39.0–52.0)
Hemoglobin: 13.5 g/dL (ref 13.0–17.0)
MCH: 30.5 pg (ref 26.0–34.0)
MCHC: 32.8 g/dL (ref 30.0–36.0)
MCV: 93 fL (ref 80.0–100.0)
Platelets: 342 10*3/uL (ref 150–400)
RBC: 4.43 MIL/uL (ref 4.22–5.81)
RDW: 14.3 % (ref 11.5–15.5)
WBC: 9.5 10*3/uL (ref 4.0–10.5)
nRBC: 0 % (ref 0.0–0.2)

## 2019-03-30 LAB — BASIC METABOLIC PANEL
Anion gap: 12 (ref 5–15)
BUN: 5 mg/dL — ABNORMAL LOW (ref 6–20)
CO2: 21 mmol/L — ABNORMAL LOW (ref 22–32)
Calcium: 8.3 mg/dL — ABNORMAL LOW (ref 8.9–10.3)
Chloride: 101 mmol/L (ref 98–111)
Creatinine, Ser: 0.75 mg/dL (ref 0.61–1.24)
GFR calc Af Amer: >60 mL/min (ref >60)
GFR calc non Af Amer: >60 mL/min (ref >60)
Glucose, Bld: 108 mg/dL — ABNORMAL HIGH (ref 70–99)
Potassium: 4.1 mmol/L (ref 3.5–5.1)
Sodium: 134 mmol/L — ABNORMAL LOW (ref 135–145)

## 2019-03-30 LAB — MAGNESIUM: Magnesium: 1.9 mg/dL (ref 1.7–2.4)

## 2019-03-30 MED ORDER — AMOXICILLIN-POT CLAVULANATE 875-125 MG PO TABS
1.0000 | ORAL_TABLET | Freq: Two times a day (BID) | ORAL | Status: DC
  Administered 2019-03-30 – 2019-04-02 (×7): 1 via ORAL
  Filled 2019-03-30 (×7): qty 1

## 2019-03-30 NOTE — Progress Notes (Signed)
[facility] Progress Note     Subjective: CC: diverticulitis Patient with no abdominal pain this AM, feels well. Tolerating CLD. Diarrhea improving as well.   Objective: Vital signs in last 24 hours: Temp:  [98.2 F (36.8 C)-99 F (37.2 C)] 99 F (37.2 C) (09/02 2005) Pulse Rate:  [71-72] 71 (09/03 0856) Resp:  [18-20] 18 (09/02 2005) BP: (118-144)/(79-88) 144/88 (09/03 0856) SpO2:  [93 %] 93 % (09/02 2005) Weight:  [88.3 kg-88.6 kg] 88.3 kg (09/03 0829) Last BM Date: 03/29/19  Intake/Output from previous day: 09/02 0701 - 09/03 0700 In: 3675.5 [P.O.:960; I.V.:2573.2; IV Piggyback:142.2] Out: 1325 [Urine:1325] Intake/Output this shift: Total I/O In: 120 [P.O.:120] Out: -   PE: Gen:  Alert, NAD, pleasant Card:  Regular rate and rhythm Pulm:  Normal effort, clear to auscultation bilaterally Abd: Soft, non-tender, non-distended, +BS Skin: warm and dry, no rashes  Psych: A&Ox3   Lab Results:  Recent Labs    03/29/19 0427 03/30/19 0524  WBC 12.2* 9.5  HGB 13.1 13.5  HCT 39.4 41.2  PLT 336 342   BMET Recent Labs    03/29/19 0427 03/30/19 0524  NA 134* 134*  K 3.6 4.1  CL 103 101  CO2 21* 21*  GLUCOSE 100* 108*  BUN 10 5*  CREATININE 0.69 0.75  CALCIUM 8.1* 8.3*   PT/INR No results for input(s): LABPROT, INR in the last 72 hours. CMP     Component Value Date/Time   NA 134 (L) 03/30/2019 0524   K 4.1 03/30/2019 0524   CL 101 03/30/2019 0524   CO2 21 (L) 03/30/2019 0524   GLUCOSE 108 (H) 03/30/2019 0524   BUN 5 (L) 03/30/2019 0524   CREATININE 0.75 03/30/2019 0524   CALCIUM 8.3 (L) 03/30/2019 0524   PROT 5.9 (L) 03/27/2019 0600   ALBUMIN 2.7 (L) 03/27/2019 0600   AST 13 (L) 03/27/2019 0600   ALT 12 03/27/2019 0600   ALKPHOS 54 03/27/2019 0600   BILITOT 1.2 03/27/2019 0600   GFRNONAA >60 03/30/2019 0524   GFRAA >60 03/30/2019 0524   Lipase     Component Value Date/Time   LIPASE 21 03/26/2019 1829        Studies/Results: No results found.  Anti-infectives: Anti-infectives (From admission, onward)   Start     Dose/Rate Route Frequency Ordered Stop   03/30/19 1000  amoxicillin-clavulanate (AUGMENTIN) 875-125 MG per tablet 1 tablet     1 tablet Oral Every 12 hours 03/30/19 0857     03/27/19 0830  vancomycin (VANCOCIN) 1,250 mg in sodium chloride 0.9 % 250 mL IVPB  Status:  Discontinued     1,250 mg 166.7 mL/hr over 90 Minutes Intravenous Every 12 hours 03/26/19 1922 03/27/19 0739   03/27/19 0800  piperacillin-tazobactam (ZOSYN) IVPB 3.375 g  Status:  Discontinued     3.375 g 12.5 mL/hr over 240 Minutes Intravenous Every 8 hours 03/27/19 0750 03/30/19 0857   03/27/19 0600  metroNIDAZOLE (FLAGYL) IVPB 500 mg  Status:  Discontinued     500 mg 100 mL/hr over 60 Minutes Intravenous Every 8 hours 03/26/19 2313 03/27/19 0739   03/27/19 0330  ceFEPIme (MAXIPIME) 2 g in sodium chloride 0.9 % 100 mL IVPB  Status:  Discontinued     2 g 200 mL/hr over 30 Minutes Intravenous Every 8 hours 03/26/19 1922 03/27/19 0739   03/26/19 1930  vancomycin (VANCOCIN) 2,000 mg in sodium chloride 0.9 % 500 mL IVPB     2,000 mg 250 mL/hr over 120  Minutes Intravenous  Once 03/26/19 1919 03/26/19 2151   03/26/19 1915  ceFEPIme (MAXIPIME) 2 g in sodium chloride 0.9 % 100 mL IVPB     2 g 200 mL/hr over 30 Minutes Intravenous  Once 03/26/19 1910 03/26/19 2026   03/26/19 1915  metroNIDAZOLE (FLAGYL) IVPB 500 mg     500 mg 100 mL/hr over 60 Minutes Intravenous  Once 03/26/19 1910 03/26/19 2101   03/26/19 1915  vancomycin (VANCOCIN) IVPB 1000 mg/200 mL premix  Status:  Discontinued     1,000 mg 200 mL/hr over 60 Minutes Intravenous  Once 03/26/19 1910 03/26/19 1919       Assessment/Plan HTN Depression Tobacco use Mild protein calorie malnutrition Hypokalemia- resolved  Sigmoid diverticulitis with microperforation and adjacent fluid collection - collection not amenable to percutaneous drainage -  WBC normalized today, afebrile - tolerated CLD and patient with minimal pain - advance to FLD - transition to PO abx today - CT abdomen pelvis tomorrow AM for follow up  - if patient able to make it through this admission without surgery, will need colonoscopy in 6-8 weeks   FEN: FLD, SLIV VTE: SCDs, lovenox ID: Zosyn 8/30>9/3; PO augmentin 9/3>>  If CT looks ok tomorrow and patient tolerated PO abx and transition to soft diet tomorrow, may be ready for discharge tomorrow or Saturday AM.   LOS: 4 days    Brigid Re , [facility] 03/30/2019, 8:57 AM Pager: [phone] Consults: [phone] 7:00 AM - 4:30 PM M, W-F 7:00 AM - 11:30 AM Tues, Sat, Sun

## 2019-03-30 NOTE — Plan of Care (Signed)
Nutrition Education Note  RD consulted for nutrition education regarding nutrition management for diverticulosis/ Diverticulitis.    RD provided "Low FIber Nutrition Therapy" handout from the Academy of Nutrition and Dietetics. Reviewed patient's dietary recall and discussed ways for pt to meet nutrition goals over the next several weeks. Explained reasons for pt to follow a low fiber diet over the next 6-8 weeks. Reviewed low fiber foods and high fiber foods. Discussed best practice for long term management of diverticulosis is a high fiber diet and discussed ways to gradually increase fiber in the diet.   Teach back method used. Pt verbalizes understanding of information provided.   Expect fair to good compliance.  Body mass index is Body mass index is 27.94 kg/m.Marland Kitchen Pt meets criteria for over weight based on current BMI.  Current diet order is full liquid, patient is consuming approximately 100% of meals at this time. Labs and medications reviewed. No further nutrition interventions warranted at this time. RD contact information provided. If additional nutrition issues arise, please re-consult RD.  Kieren Ricci A. Jimmye Norman, RD, LDN, [place] Registered Dietitian II Certified Diabetes Care and Education Specialist Pager: [phone] After hours Pager: [phone]

## 2019-03-30 NOTE — Progress Notes (Addendum)
PROGRESS NOTE    Victor Boyer  [id number] DOB: 1966-02-25 DOA: 03/26/2019 PCP: Patient, No Pcp Per    Brief Narrative:   Victor Boyer is a 53 year old male with history of hypertension, hyperlipidemia, depression, anxiety who presented with abdominal pain in the left lower quadrant.  In the emergency department he was found to be febrile.  CT abdomen/pelvis showed sigmoid diverticulitis with possible microperforation and fluid collection.  General surgery consulted.  Started on conservative management.   Assessment & Plan:   Principal Problem:   Acute diverticulitis Active Problems:   Major depressive disorder, single episode, severe (HCC)   SIRS (systemic inflammatory response syndrome) (HCC)   Essential hypertension   Sigmoid diverticulitis:  Patient presenting with progressive left lower quadrant pain.  CT abdomen/pelvis notable for sigmoid diverticulitis with possible microperforation and fluid collection.  IR consulted, no significant fluid collection for aspiration/drainage.  General surgery consulted and recommended conservative management with IV antibiotics and pain control. --WBC 19.5-->13.9-->12.2-->9.5 --Urine culture: No growth --Blood cultures x2: No growth x3 days --Zosyn transitioned to Augmentin today --Pain control with oxycodone IR prn --Surgery advancing diet from clears to full liquid today --Plan repeat CT abdomen/pelvis tomorrow --Will need colonoscopy outpatient in 6-8 weeks; no prior colonoscopies in the past  Hypertension:  Currently blood pressure stable.   --Lisinopril 20 mg p.o. daily Continue to monitor blood pressure.  Continue PRN meds  Hyperlipidemia: continue fenofibrate .  History of anxiety/depression: Continue home Celexa, BuSpar and trazodone at home.  Hypokalemia:  Repleted --Continue to monitor electrolytes to include magnesium daily   DVT prophylaxis: Lovenox Code Status: Full code Family Communication: None Disposition  Plan: Anticipate discharge home in 1-2 days, pending in general surgery clearance   Consultants:   General surgery  Procedures:   None  Antimicrobials:   Zosyn 8/30 - 9/3  Augmentin 9/3>>   Subjective: Patient seen and examined at bedside, resting comfortably.  States abdominal discomfort is relatively resolved.  Tolerating clear liquid diet without issue.  Seen by general surgery this morning, advancing diet to full liquid, transitioning IV Zosyn to Augmentin, and plan repeat CT abdomen/pelvis tomorrow morning. No other complaints or concerns at this time.  Denies headache, no fever/chills/night sweats, no nausea vomiting, no chest pain, no palpitations, no weakness, no issues with bladder function, no paresthesias.  No acute events overnight per nursing staff.  Objective: Vitals:   03/29/19 2005 03/30/19 0517 03/30/19 0829 03/30/19 0856  BP: 138/85   (!) 144/88  Pulse: 71   71  Resp: 18     Temp: 99 F (37.2 C)     TempSrc: Oral     SpO2: 93%     Weight:  88.6 kg 88.3 kg   Height:        Intake/Output Summary (Last 24 hours) at 03/30/2019 1123 Last data filed at 03/30/2019 0932 Gross per 24 hour  Intake 3635.46 ml  Output 925 ml  Net 2710.46 ml   Filed Weights   03/29/19 0444 03/30/19 0517 03/30/19 0829  Weight: 87.6 kg 88.6 kg 88.3 kg    Examination:  General exam: Appears calm and comfortable  Respiratory system: Clear to auscultation. Respiratory effort normal. Cardiovascular system: S1 & S2 heard, RRR. No JVD, murmurs, rubs, gallops or clicks. No pedal edema. Gastrointestinal system: Abdomen is nondistended, soft, NTTP, No organomegaly or masses felt. Normal bowel sounds heard. Central nervous system: Alert and oriented. No focal neurological deficits. Extremities: Symmetric 5 x 5 power. Skin: No rashes, lesions or ulcers  Psychiatry: Judgement and insight appear normal. Mood & affect appropriate.     Data Reviewed: I have personally reviewed following  labs and imaging studies  CBC: Recent Labs  Lab 03/26/19 1829 03/27/19 0600 03/28/19 0357 03/29/19 0427 03/30/19 0524  WBC 20.9* 19.5* 13.9* 12.2* 9.5  NEUTROABS  --  15.8* 11.0* 9.4*  --   HGB 15.0 13.1 14.2 13.1 13.5  HCT 43.5 39.0 43.2 39.4 41.2  MCV 91.0 91.8 93.7 93.4 93.0  PLT 302 265 263 336 [id number]   Basic Metabolic Panel: Recent Labs  Lab 03/26/19 1829 03/27/19 0600 03/28/19 0357 03/29/19 0427 03/30/19 0524  NA 128* 129* 134* 134* 134*  K 3.3* 3.2* 3.3* 3.6 4.1  CL 93* 98 103 103 101  CO2 23 20* 19* 21* 21*  GLUCOSE 108* 91 64* 100* 108*  BUN 14 10 11 10  5*  CREATININE 0.90 0.83 0.78 0.69 0.75  CALCIUM 8.6* 7.9* 8.5* 8.1* 8.3*  MG  --   --  1.9  --  1.9   GFR: Estimated Creatinine Clearance: 122.2 mL/min (by C-G formula based on SCr of 0.75 mg/dL). Liver Function Tests: Recent Labs  Lab 03/26/19 1829 03/27/19 0600  AST 12* 13*  ALT 13 12  ALKPHOS 57 54  BILITOT 1.0 1.2  PROT 6.6 5.9*  ALBUMIN 3.3* 2.7*   Recent Labs  Lab 03/26/19 1829  LIPASE 21   No results for input(s): AMMONIA in the last 168 hours. Coagulation Profile: No results for input(s): INR, PROTIME in the last 168 hours. Cardiac Enzymes: No results for input(s): CKTOTAL, CKMB, CKMBINDEX, TROPONINI in the last 168 hours. BNP (last 3 results) No results for input(s): PROBNP in the last 8760 hours. HbA1C: No results for input(s): HGBA1C in the last 72 hours. CBG: No results for input(s): GLUCAP in the last 168 hours. Lipid Profile: No results for input(s): CHOL, HDL, LDLCALC, TRIG, CHOLHDL, LDLDIRECT in the last 72 hours. Thyroid Function Tests: No results for input(s): TSH, T4TOTAL, FREET4, T3FREE, THYROIDAB in the last 72 hours. Anemia Panel: No results for input(s): VITAMINB12, FOLATE, FERRITIN, TIBC, IRON, RETICCTPCT in the last 72 hours. Sepsis Labs: Recent Labs  Lab 03/26/19 1835  LATICACIDVEN 0.9    Recent Results (from the past 240 hour(s))  SARS Coronavirus 2  [facility] order, Performed in [facility] hospital lab) Nasopharyngeal Nasopharyngeal Swab     Status: None   Collection Time: 03/26/19  6:57 PM   Specimen: Nasopharyngeal Swab  Result Value Ref Range Status   SARS Coronavirus 2 NEGATIVE NEGATIVE Final    Comment: (NOTE) If result is NEGATIVE SARS-CoV-2 target nucleic acids are NOT DETECTED. The SARS-CoV-2 RNA is generally detectable in upper and lower  respiratory specimens during the acute phase of infection. The lowest  concentration of SARS-CoV-2 viral copies this assay can detect is 250  copies / mL. A negative result does not preclude SARS-CoV-2 infection  and should not be used as the sole basis for treatment or other  patient management decisions.  A negative result may occur with  improper specimen collection / handling, submission of specimen other  than nasopharyngeal swab, presence of viral mutation(s) within the  areas targeted by this assay, and inadequate number of viral copies  (<250 copies / mL). A negative result must be combined with clinical  observations, patient history, and epidemiological information. If result is POSITIVE SARS-CoV-2 target nucleic acids are DETECTED. The SARS-CoV-2 RNA is generally detectable in upper and lower  respiratory specimens dur ing the  acute phase of infection.  Positive  results are indicative of active infection with SARS-CoV-2.  Clinical  correlation with patient history and other diagnostic information is  necessary to determine patient infection status.  Positive results do  not rule out bacterial infection or co-infection with other viruses. If result is PRESUMPTIVE POSTIVE SARS-CoV-2 nucleic acids MAY BE PRESENT.   A presumptive positive result was obtained on the submitted specimen  and confirmed on repeat testing.  While 2019 novel coronavirus  (SARS-CoV-2) nucleic acids may be present in the submitted sample  additional confirmatory testing may be necessary for  epidemiological  and / or clinical management purposes  to differentiate between  SARS-CoV-2 and other Sarbecovirus currently known to infect humans.  If clinically indicated additional testing with an alternate test  methodology [phone]) is advised. The SARS-CoV-2 RNA is generally  detectable in upper and lower respiratory sp ecimens during the acute  phase of infection. The expected result is Negative. Fact Sheet for Patients:  StrictlyIdeas.no Fact Sheet for Healthcare Providers: BankingDealers.co.za This test is not yet approved or cleared by the [place] FDA and has been authorized for detection and/or diagnosis of SARS-CoV-2 by FDA under an Emergency Use Authorization (EUA).  This EUA will remain in effect (meaning this test can be used) for the duration of the COVID-19 declaration under Section 564(b)(1) of the Act, 21 U.S.C. section 360bbb-3(b)(1), unless the authorization is terminated or revoked sooner. Performed at [facility], [place] [address], [place], [place] [address]   Blood Culture (routine x 2)     Status: None (Preliminary result)   Collection Time: 03/26/19  6:58 PM   Specimen: BLOOD  Result Value Ref Range Status   Specimen Description BLOOD LEFT ANTECUBITAL  Final   Special Requests   Final    BOTTLES DRAWN AEROBIC AND ANAEROBIC Blood Culture adequate volume   Culture   Final    NO GROWTH 3 DAYS Performed at [facility], [place] [address], [place], [place] [address]    Report Status PENDING  Incomplete  Blood Culture (routine x 2)     Status: None (Preliminary result)   Collection Time: 03/26/19  7:46 PM   Specimen: BLOOD RIGHT FOREARM  Result Value Ref Range Status   Specimen Description BLOOD RIGHT FOREARM  Final   Special Requests   Final    BOTTLES DRAWN AEROBIC AND ANAEROBIC Blood Culture results may not be optimal due to an inadequate volume of blood received in culture bottles   Culture    Final    NO GROWTH 3 DAYS Performed at [facility], [place] [address], [place], [place] [address]    Report Status PENDING  Incomplete  Urine culture     Status: None   Collection Time: 03/26/19  9:37 PM   Specimen: In/Out Cath Urine  Result Value Ref Range Status   Specimen Description IN/OUT CATH URINE  Final   Special Requests NONE  Final   Culture   Final    NO GROWTH Performed at [facility], [place] [address], [place], [place] [address]    Report Status 03/27/2019 FINAL  Final         Radiology Studies: No results found.      Scheduled Meds: . amoxicillin-clavulanate  1 tablet Oral Q12H  . busPIRone  5 mg Oral BID  . citalopram  20 mg Oral Daily  . enoxaparin (LOVENOX) injection  40 mg Subcutaneous Daily  . fenofibrate  160 mg Oral Daily  . lisinopril  20 mg Oral Daily  . traZODone  50 mg Oral QHS   Continuous Infusions: . sodium chloride 1,000 mL (03/28/19 2256)     LOS: 4 days    Time spent: 32 minutes spent on chart review, discussion with nursing staff, personally reviewing all imaging studies and labs, consultants, updating family and interview/physical exam; more than 50% of that time was spent in counseling and/or coordination of care.     J [place] DO [facility] Pager [phone]  If 7PM-7AM, please contact night-coverage [email or web address] Password [email or web address] 03/30/2019, 11:23 AM

## 2019-03-30 NOTE — Plan of Care (Signed)
  Problem: Activity: Goal: Risk for activity intolerance will decrease Outcome: Progressing   Problem: Pain Managment: Goal: General experience of comfort will improve Outcome: Progressing   Problem: Safety: Goal: Ability to remain free from injury will improve Outcome: Progressing   

## 2019-03-31 ENCOUNTER — Inpatient Hospital Stay (HOSPITAL_COMMUNITY): Payer: Commercial Managed Care - PPO

## 2019-03-31 LAB — CBC
HCT: 39.9 % (ref 39.0–52.0)
Hemoglobin: 13.3 g/dL (ref 13.0–17.0)
MCH: 30.8 pg (ref 26.0–34.0)
MCHC: 33.3 g/dL (ref 30.0–36.0)
MCV: 92.4 fL (ref 80.0–100.0)
Platelets: 377 10*3/uL (ref 150–400)
RBC: 4.32 MIL/uL (ref 4.22–5.81)
RDW: 13.9 % (ref 11.5–15.5)
WBC: 11.1 10*3/uL — ABNORMAL HIGH (ref 4.0–10.5)
nRBC: 0 % (ref 0.0–0.2)

## 2019-03-31 LAB — CULTURE, BLOOD (ROUTINE X 2)
Culture: NO GROWTH
Culture: NO GROWTH
Special Requests: ADEQUATE

## 2019-03-31 LAB — BASIC METABOLIC PANEL
Anion gap: 9 (ref 5–15)
BUN: 6 mg/dL (ref 6–20)
CO2: 26 mmol/L (ref 22–32)
Calcium: 8.5 mg/dL — ABNORMAL LOW (ref 8.9–10.3)
Chloride: 101 mmol/L (ref 98–111)
Creatinine, Ser: 0.78 mg/dL (ref 0.61–1.24)
GFR calc Af Amer: >60 mL/min (ref >60)
GFR calc non Af Amer: >60 mL/min (ref >60)
Glucose, Bld: 91 mg/dL (ref 70–99)
Potassium: 4.2 mmol/L (ref 3.5–5.1)
Sodium: 136 mmol/L (ref 135–145)

## 2019-03-31 MED ORDER — IOHEXOL 300 MG/ML  SOLN
100.0000 mL | Freq: Once | INTRAMUSCULAR | Status: AC | PRN
  Administered 2019-03-31: 100 mL via INTRAVENOUS

## 2019-03-31 MED ORDER — ENOXAPARIN SODIUM 40 MG/0.4ML ~~LOC~~ SOLN: 40.0000 mg | Freq: Every day | SUBCUTANEOUS | Status: DC

## 2019-03-31 NOTE — Progress Notes (Addendum)
Chief Complaint: Patient was seen in consultation today for abdominal abscess  Referring Physician(s): Brigid Re PA-C  Supervising Physician: Aletta Edouard  Patient Status: [facility] - In-pt  History of Present Illness: Victor Boyer is a 53 y.o. male admitted with divirticulitis and abscess. He has been on ICV abx for several days and repeat CT today shows the abscess has increased in [place]. IR is asked to place perc drain. PMHx, meds, labs, imaging, allergies reviewed. Feels better overall. Has been NPO today as directed. Family at bedside.   Past Medical History:  Diagnosis Date   Anxiety    Depression    High cholesterol    Hypertension     Past Surgical History:  Procedure Laterality Date   APPENDECTOMY      Allergies: Patient has no known allergies.  Medications:  Current Facility-Administered Medications:    0.9 %  sodium chloride infusion, , Intravenous, PRN, Shelly Coss, MD, Last Rate: 10 mL/hr at 03/28/19 2256, 1,000 mL at 03/28/19 2256   acetaminophen (TYLENOL) tablet 650 mg, 650 mg, Oral, Q6H PRN, 650 mg at 03/28/19 1959 **OR** acetaminophen (TYLENOL) suppository 650 mg, 650 mg, Rectal, Q6H PRN, Rise Patience, MD   amoxicillin-clavulanate (AUGMENTIN) 875-125 MG per tablet 1 tablet, 1 tablet, Oral, Q12H, Rayburn, Kelly A, PA-C, 1 tablet at 03/31/19 1252   busPIRone (BUSPAR) tablet 5 mg, 5 mg, Oral, BID, Rise Patience, MD, 5 mg at 03/31/19 1253   citalopram (CELEXA) tablet 20 mg, 20 mg, Oral, Daily, Rise Patience, MD, 20 mg at 03/31/19 1252   enoxaparin (LOVENOX) injection 40 mg, 40 mg, Subcutaneous, Daily, Amin, Ankit Chirag, MD, 40 mg at 03/31/19 1253   fenofibrate tablet 160 mg, 160 mg, Oral, Daily, Rise Patience, MD, 160 mg at 03/31/19 1252   hydrALAZINE (APRESOLINE) injection 10 mg, 10 mg, Intravenous, Q4H PRN, Rise Patience, MD   lisinopril (ZESTRIL) tablet 20 mg, 20 mg, Oral, Daily, Rise Patience, MD, 20 mg at 03/31/19 1252   morphine 2 MG/ML injection 1 mg, 1 mg, Intravenous, Q3H PRN, Rayburn, Kelly A, PA-C   ondansetron (ZOFRAN) tablet 4 mg, 4 mg, Oral, Q6H PRN **OR** ondansetron (ZOFRAN) injection 4 mg, 4 mg, Intravenous, Q6H PRN, Rise Patience, MD   oxyCODONE (Oxy IR/ROXICODONE) immediate release tablet 5-10 mg, 5-10 mg, Oral, Q4H PRN, Rayburn, Kelly A, PA-C, 5 mg at 03/28/19 1517   traZODone (DESYREL) tablet 50 mg, 50 mg, Oral, QHS, Rise Patience, MD, 50 mg at 03/30/19 2234    Family History  Problem Relation Age of Onset   Prostate cancer Maternal Grandfather     Social History   Socioeconomic History   Marital status: Married    Spouse name: Not on file   Number of children: Not on file   Years of education: Not on file   Highest education level: Not on file  Occupational History   Not on file  Social Needs   Financial resource strain: Not on file   Food insecurity    Worry: Not on file    Inability: Not on file   Transportation needs    Medical: Not on file    Non-medical: Not on file  Tobacco Use   Smoking status: Current Every Day Smoker    Packs/day: 1.00    Types: Cigarettes   Smokeless tobacco: Never Used  Substance and Sexual Activity   Alcohol use: Yes    Comment: 24 pack/week   Drug use: Never  Sexual activity: Yes    Birth control/protection: Coitus interruptus  Lifestyle   Physical activity    Days per week: Not on file    Minutes per session: Not on file   Stress: Not on file  Relationships   Social connections    Talks on phone: Not on file    Gets together: Not on file    Attends religious service: Not on file    Active member of club or organization: Not on file    Attends meetings of clubs or organizations: Not on file    Relationship status: Not on file  Other Topics Concern   Not on file  Social History Narrative   Not on file    Review of Systems: A 12 point ROS discussed and  pertinent positives are indicated in the HPI above.  All other systems are negative.  Review of Systems  Vital Signs: BP 128/90 (BP Location: Right Arm)    Pulse 72    Temp 98.1 F (36.7 C) (Oral)    Resp 18    Ht 5\' 10"  (1.778 m)    Wt 86.6 kg    SpO2 95%    BMI 27.39 kg/m   Physical Exam Constitutional:      Appearance: Normal appearance.  HENT:     Head: Normocephalic and atraumatic.     Mouth/Throat:     Mouth: Mucous membranes are moist.  Cardiovascular:     Rate and Rhythm: Normal rate and regular rhythm.     Heart sounds: Normal heart sounds.  Pulmonary:     Effort: Pulmonary effort is normal.     Breath sounds: Normal breath sounds.  Skin:    General: Skin is warm and dry.  Neurological:     General: No focal deficit present.     Mental Status: He is alert.  Psychiatric:        Mood and Affect: Mood normal.        Judgment: Judgment normal.       Imaging: Ct Abdomen Pelvis W Contrast  Result Date: 03/31/2019 CLINICAL DATA:  Follow-up diverticulitis EXAM: CT ABDOMEN AND PELVIS WITH CONTRAST TECHNIQUE: Multidetector CT imaging of the abdomen and pelvis was performed using the standard protocol following bolus administration of intravenous contrast. CONTRAST:  170mL OMNIPAQUE IOHEXOL 300 MG/ML SOLN, additional oral enteric contrast COMPARISON:  03/26/2019 FINDINGS: Lower chest: Small bilateral pleural effusions and associated atelectasis or consolidation. Hepatobiliary: No solid liver abnormality is seen. Hepatic steatosis. No gallstones, gallbladder wall thickening, or biliary dilatation. Pancreas: Unremarkable. No pancreatic ductal dilatation or surrounding inflammatory changes. Spleen: Normal in size without significant abnormality. Adrenals/Urinary Tract: Adrenal glands are unremarkable. Kidneys are normal, without renal calculi, solid lesion, or hydronephrosis. Bladder is unremarkable. Stomach/Bowel: Stomach is within normal limits. Descending and sigmoid diverticulosis  with extensive thickening of the mid to distal sigmoid and a large abscess overlying left inguinal vessels measuring 5.7 x 2.7 cm (series 3, image 70). This is substantially enlarged compared to prior examination at which time it measured 4.1 x 2.1 cm when measured similarly. There is an additional intramural component of abscess along the left aspect of the distal sigmoid measuring 1.5 cm (series 3, image 71), which is similar to prior examination. Vascular/Lymphatic: No significant vascular findings are present. No enlarged abdominal or pelvic lymph nodes. Reproductive: No mass or other significant abnormality. Other: Small fat containing bilateral inguinal hernias. No abdominopelvic ascites. Musculoskeletal: No acute or significant osseous findings. IMPRESSION: 1. Descending and sigmoid diverticulosis  with extensive thickening of the mid to distal sigmoid and a large abscess overlying left inguinal vessels measuring 5.7 x 2.7 cm (series 3, image 70). This is substantially enlarged compared to prior examination at which time it measured 4.1 x 2.1 cm when measured similarly. There is an additional intramural component of abscess along the left aspect of the distal sigmoid measuring 1.5 cm (series 3, image 71), which is similar to prior examination. Overall findings remain consistent with acute diverticulitis complicated by abscess. No evidence of overt perforation. 2.  Other chronic and incidental findings as detailed above. Electronically Signed   By: Eddie Candle M.D.   On: 03/31/2019 13:51   Ct Abdomen Pelvis W Contrast  Result Date: 03/26/2019 CLINICAL DATA:  Abdominal pain with diverticulitis suspected. EXAM: CT ABDOMEN AND PELVIS WITH CONTRAST TECHNIQUE: Multidetector CT imaging of the abdomen and pelvis was performed using the standard protocol following bolus administration of intravenous contrast. CONTRAST:  124mL OMNIPAQUE IOHEXOL 300 MG/ML  SOLN COMPARISON:  None. FINDINGS: Lower chest: There is  atelectasis at the lung bases.The heart size is normal. Hepatobiliary: There is decreased hepatic attenuation suggestive of hepatic steatosis. Normal gallbladder.There is no biliary ductal dilation. Pancreas: Normal contours without ductal dilatation. No peripancreatic fluid collection. Spleen: No splenic laceration or hematoma. Adrenals/Urinary Tract: --Adrenal glands: No adrenal hemorrhage. --Right kidney/ureter: No hydronephrosis or perinephric hematoma. --Left kidney/ureter: No hydronephrosis or perinephric hematoma. --Urinary bladder: There is some asymmetric bladder wall thickening which is presumably reactive. Stomach/Bowel: --Stomach/Duodenum: No hiatal hernia or other gastric abnormality. Normal duodenal course and caliber. --Small bowel: No dilatation or inflammation. --Colon: There is sigmoid diverticulitis. There is an adjacent fluid collection measuring 4.1 x 2.1 cm. There are scattered colonic diverticula. --Appendix: Not visualized. No right lower quadrant inflammation or free fluid. Vascular/Lymphatic: Normal course and caliber of the major abdominal vessels. There is a circumaortic left renal vein, a normal variant. --No retroperitoneal lymphadenopathy. --No mesenteric lymphadenopathy. --No pelvic or inguinal lymphadenopathy. Reproductive: Unremarkable Other: There is a trace amount of extraluminal free air. There is reactive free fluid in the left pericolic gutter. There are bilateral fat containing inguinal hernias. Musculoskeletal. No acute displaced fractures. IMPRESSION: 1. Findings consistent with sigmoid diverticulitis. There is an adjacent 4.1 x 2.1 cm fluid collection which may represent a collection of reactive free fluid. A developing abscess is not excluded. There is a trace amount of extraluminal free air adjacent to this fluid collection, best visualized on the coronal view. 2. Hepatic steatosis. 3. Asymmetric bladder wall thickening, presumably reactive. Electronically Signed   By:  Constance Holster M.D.   On: 03/26/2019 21:02   Dg Chest Port 1 View  Result Date: 03/26/2019 CLINICAL DATA:  Fever EXAM: PORTABLE CHEST 1 VIEW COMPARISON:  None. FINDINGS: This is a mildly low volume film. The cardiomediastinal silhouette is unremarkable. Peribronchial thickening noted. There is no evidence of focal airspace disease, pulmonary edema, suspicious pulmonary nodule/mass, pleural effusion, or pneumothorax. No acute bony abnormalities are identified. IMPRESSION: Peribronchial thickening without evidence of focal airspace disease. Electronically Signed   By: Margarette Canada M.D.   On: 03/26/2019 19:44    Labs:  CBC: Recent Labs    03/28/19 0357 03/29/19 0427 03/30/19 0524 03/31/19 0519  WBC 13.9* 12.2* 9.5 11.1*  HGB 14.2 13.1 13.5 13.3  HCT 43.2 39.4 41.2 39.9  PLT 263 336 342 377    COAGS: No results for input(s): INR, APTT in the last 8760 hours.  BMP: Recent Labs    03/28/19  [id number] 03/29/19 0427 03/30/19 0524 03/31/19 0519  NA 134* 134* 134* 136  K 3.3* 3.6 4.1 4.2  CL 103 103 101 101  CO2 19* 21* 21* 26  GLUCOSE 64* 100* 108* 91  BUN 11 10 5* 6  CALCIUM 8.5* 8.1* 8.3* 8.5*  CREATININE 0.78 0.69 0.75 0.78  GFRNONAA >60 >60 >60 >60  GFRAA >60 >60 >60 >60    LIVER FUNCTION TESTS: Recent Labs    01/21/19 0616 03/26/19 1829 03/27/19 0600  BILITOT 0.4 1.0 1.2  AST 14* 12* 13*  ALT 16 13 12   ALKPHOS 60 57 54  PROT 7.5 6.6 5.9*  ALBUMIN 4.1 3.3* 2.7*    TUMOR MARKERS: No results for input(s): AFPTM, CEA, CA199, CHROMGRNA in the last 8760 hours.  Assessment and Plan: Diverticulitis with abscess. For CT guided drain tomorrow. NPO p MN Hold Lovenox tomorrow until after procedure. Risks and benefits discussed with the patient including bleeding, infection, damage to adjacent structures, bowel perforation/fistula connection, and sepsis.  All of the patient's questions were answered, patient is agreeable to proceed. Consent signed and in  chart.    Thank you for this interesting consult.  I greatly enjoyed meeting Donld Luebbe and look forward to participating in their care.  A copy of this report was sent to the requesting provider on this date.  Electronically Signed: Ascencion Dike, PA-C 03/31/2019, 3:01 PM   I spent a total of 25 minutes in face to face in clinical consultation, greater than 50% of which was counseling/coordinating care for abscess drain

## 2019-03-31 NOTE — Progress Notes (Signed)
  CT abdomen pelvis:   Descending and sigmoid diverticulosis with extensive thickening of the mid to distal sigmoid and a large abscess overlying left inguinal vessels measuring 5.7 x 2.7 cm (series 3, image 70). This is substantially enlarged compared to prior examination at which time it measured 4.1 x 2.1 cm when measured similarly. There is an additional intramural component of abscess along the left aspect of the distal sigmoid measuring 1.5 cm (series 3, image 71), which is similar to prior examination. Overall findings remain consistent with acute diverticulitis complicated by abscess. No evidence of overt perforation.  Have consulted IR for percutaneous drainage. Keep patient NPO until seen by IR. Advance as tolerated to soft diet after drain placement. Still recommend total course of 14 days antibiotics.  I updated patient and his wife on plan.  Brigid Re , [facility] 03/31/2019, 2:28 PM Pager: [phone]

## 2019-03-31 NOTE — Progress Notes (Signed)
[facility] Progress Note     Subjective: CC: diverticulitis Patient tolerating FLD. Denies nausea. Diarrhea resolved. Denies pain.   Objective: Vital signs in last 24 hours: Temp:  [98.4 F (36.9 C)-98.7 F (37.1 C)] 98.7 F (37.1 C) (09/04 0435) Pulse Rate:  [66-74] 66 (09/04 0435) Resp:  [18] 18 (09/04 0435) BP: (131-138)/(76-83) 131/76 (09/04 0435) SpO2:  [92 %-93 %] 93 % (09/04 0435) Weight:  [86.6 kg] 86.6 kg (09/04 0435) Last BM Date: 03/30/19  Intake/Output from previous day: 09/03 0701 - 09/04 0700 In: 840 [P.O.:840] Out: 3050 [Urine:3050] Intake/Output this shift: Total I/O In: -  Out: 300 [Urine:300]  PE: Gen:  Alert, NAD, pleasant Card:  Regular rate and rhythm Pulm:  Normal effort, clear to auscultation bilaterally Abd: Soft, non-tender, non-distended, +BS Skin: warm and dry, no rashes  Psych: A&Ox3   Lab Results:  Recent Labs    03/30/19 0524 03/31/19 0519  WBC 9.5 11.1*  HGB 13.5 13.3  HCT 41.2 39.9  PLT 342 377   BMET Recent Labs    03/30/19 0524 03/31/19 0519  NA 134* 136  K 4.1 4.2  CL 101 101  CO2 21* 26  GLUCOSE 108* 91  BUN 5* 6  CREATININE 0.75 0.78  CALCIUM 8.3* 8.5*   PT/INR No results for input(s): LABPROT, INR in the last 72 hours. CMP     Component Value Date/Time   NA 136 03/31/2019 0519   K 4.2 03/31/2019 0519   CL 101 03/31/2019 0519   CO2 26 03/31/2019 0519   GLUCOSE 91 03/31/2019 0519   BUN 6 03/31/2019 0519   CREATININE 0.78 03/31/2019 0519   CALCIUM 8.5 (L) 03/31/2019 0519   PROT 5.9 (L) 03/27/2019 0600   ALBUMIN 2.7 (L) 03/27/2019 0600   AST 13 (L) 03/27/2019 0600   ALT 12 03/27/2019 0600   ALKPHOS 54 03/27/2019 0600   BILITOT 1.2 03/27/2019 0600   GFRNONAA >60 03/31/2019 0519   GFRAA >60 03/31/2019 0519   Lipase     Component Value Date/Time   LIPASE 21 03/26/2019 1829       Studies/Results: No results found.  Anti-infectives: Anti-infectives (From admission, onward)   Start     Dose/Rate Route Frequency Ordered Stop   03/30/19 1000  amoxicillin-clavulanate (AUGMENTIN) 875-125 MG per tablet 1 tablet     1 tablet Oral Every 12 hours 03/30/19 0857     03/27/19 0830  vancomycin (VANCOCIN) 1,250 mg in sodium chloride 0.9 % 250 mL IVPB  Status:  Discontinued     1,250 mg 166.7 mL/hr over 90 Minutes Intravenous Every 12 hours 03/26/19 1922 03/27/19 0739   03/27/19 0800  piperacillin-tazobactam (ZOSYN) IVPB 3.375 g  Status:  Discontinued     3.375 g 12.5 mL/hr over 240 Minutes Intravenous Every 8 hours 03/27/19 0750 03/30/19 0857   03/27/19 0600  metroNIDAZOLE (FLAGYL) IVPB 500 mg  Status:  Discontinued     500 mg 100 mL/hr over 60 Minutes Intravenous Every 8 hours 03/26/19 2313 03/27/19 0739   03/27/19 0330  ceFEPIme (MAXIPIME) 2 g in sodium chloride 0.9 % 100 mL IVPB  Status:  Discontinued     2 g 200 mL/hr over 30 Minutes Intravenous Every 8 hours 03/26/19 1922 03/27/19 0739   03/26/19 1930  vancomycin (VANCOCIN) 2,000 mg in sodium chloride 0.9 % 500 mL IVPB     2,000 mg 250 mL/hr over 120 Minutes Intravenous  Once 03/26/19 1919 03/26/19 2151   03/26/19 1915  ceFEPIme (  MAXIPIME) 2 g in sodium chloride 0.9 % 100 mL IVPB     2 g 200 mL/hr over 30 Minutes Intravenous  Once 03/26/19 1910 03/26/19 2026   03/26/19 1915  metroNIDAZOLE (FLAGYL) IVPB 500 mg     500 mg 100 mL/hr over 60 Minutes Intravenous  Once 03/26/19 1910 03/26/19 2101   03/26/19 1915  vancomycin (VANCOCIN) IVPB 1000 mg/200 mL premix  Status:  Discontinued     1,000 mg 200 mL/hr over 60 Minutes Intravenous  Once 03/26/19 1910 03/26/19 1919       Assessment/Plan HTN Depression Tobacco use Mild protein calorie malnutrition Hypokalemia- resolved  Sigmoid diverticulitis with microperforation and adjacent fluid collection - collection not amenable to percutaneous drainage - WBC up slightly to 11, but pt remainsafebrile, clinically improved - tolerated FLD, ok to advance to soft diet if  CT ok - CT abdomen pelvis today - if patient able to make it through this admission without surgery, will need colonoscopy in 6-8 weeks   FEN: FLD, SLIV VTE: SCDs, lovenox ID: Zosyn 8/30>9/3; PO augmentin 9/3>>  If CT looks ok, will advance to soft diet. If tolerating soft diet may be able to discharge later today vs tomorrow AM. Will follow up CT results later today.   LOS: 5 days    Brigid Re , [facility] 03/31/2019, 9:39 AM Pager: [phone] Consults: [phone] 7:00 AM - 4:30 PM M, W-F 7:00 AM - 11:30 AM Tues, Sat, Sun

## 2019-03-31 NOTE — TOC Progression Note (Addendum)
Transition of Care [facility]) - Progression Note    Patient Details  Name: Victor Boyer MRN: [id number] Date of Birth: 1966/05/23  [facility]) CM/SW Contact  Zenon Mayo, RN Phone Number: 03/31/2019, 9:42 AM  Clinical Narrative:    From home with spouse, he has a PCP  Mat Carne PA at [facility] [place] [address], his goal is to get better and do his diet like he is suppose to and get back on track.  He is scheduled for a CT today at 11:30 per patient.  He has no issues in getting medications.  He has transportation at [employer].        Expected Discharge Plan and Services           Expected Discharge Date: 03/28/19                                     Social Determinants of Health (SDOH) Interventions    Readmission Risk Interventions No flowsheet data found.

## 2019-03-31 NOTE — Plan of Care (Signed)

## 2019-03-31 NOTE — Progress Notes (Signed)
PROGRESS NOTE    Chibuikem Pall  [id number] DOB: 1966/06/21 DOA: 03/26/2019 PCP: Patient, No Pcp Per    Brief Narrative:   Zaylan Woosley is a 53 year old male with history of hypertension, hyperlipidemia, depression, anxiety who presented with abdominal pain in the left lower quadrant.  In the emergency department he was found to be febrile.  CT abdomen/pelvis showed sigmoid diverticulitis with possible microperforation and fluid collection.  General surgery consulted.  Started on conservative management.   Assessment & Plan:   Principal Problem:   Acute diverticulitis Active Problems:   Major depressive disorder, single episode, severe (HCC)   SIRS (systemic inflammatory response syndrome) (HCC)   Essential hypertension   Sigmoid diverticulitis:  Patient presenting with progressive left lower quadrant pain.  CT abdomen/pelvis notable for sigmoid diverticulitis with possible microperforation and fluid collection.  IR consulted, no significant fluid collection for aspiration/drainage.  General surgery consulted and recommended conservative management with IV antibiotics and pain control. --WBC 19.5-->13.9-->12.2-->9.5-->11.1 --Urine culture: No growth --Blood cultures x2: No growth x5 days --Zosyn transitioned to Augmentin on 9/3, plan total course of 14 days (E: 04/09/2019 --repeat CT A/P with descending/sigmoid diverticulosis with extensive thickening with large abscess with interval enlargement since previous CT scan --General surgery consulted IR for drain placement today --N.p.o. for now until drain placed, then will advance as tolerated --Pain control with oxycodone IR prn --Will need colonoscopy outpatient in 6-8 weeks; no prior colonoscopies in the past  Hypertension:  Currently blood pressure stable.   --Lisinopril 20 mg p.o. daily Continue to monitor blood pressure.  Continue PRN meds  Hyperlipidemia: continue fenofibrate .  History of anxiety/depression: Continue  home Celexa, BuSpar and trazodone at home.  Hypokalemia:  Repleted --Continue to monitor electrolytes to include magnesium daily   DVT prophylaxis: Lovenox Code Status: Full code Family Communication: None Disposition Plan: Continue inpatient, for IR drain placement today, discharge pending general surgery clearance   Consultants:   General surgery  Procedures:   None  Antimicrobials:   Zosyn 8/30 - 9/3  Augmentin 9/3>> (projected end date 04/09/2019)   Subjective: Patient seen and examined at bedside, resting comfortably.  Tolerating diet, no abdominal pain.  Interval CT scan today shows enlargement of his abscess.  General surgery consulting IR for drain placement.  Continues on antibiotics.  Denies headache, no fever/chills/night sweats, no nausea vomiting, no chest pain, no palpitations, no weakness, no issues with bladder function, no paresthesias.  No acute events overnight per nursing staff.  Objective: Vitals:   03/30/19 0856 03/30/19 1959 03/31/19 0435 03/31/19 1243  BP: (!) 144/88 138/83 131/76 128/90  Pulse: 71 74 66 72  Resp:  18 18 18   Temp:  98.4 F (36.9 C) 98.7 F (37.1 C) 98.1 F (36.7 C)  TempSrc:  Oral Oral Oral  SpO2:  92% 93% 95%  Weight:   86.6 kg   Height:        Intake/Output Summary (Last 24 hours) at 03/31/2019 1500 Last data filed at 03/31/2019 1249 Gross per 24 hour  Intake 480 ml  Output 4050 ml  Net -3570 ml   Filed Weights   03/30/19 0517 03/30/19 0829 03/31/19 0435  Weight: 88.6 kg 88.3 kg 86.6 kg    Examination:  General exam: Appears calm and comfortable  Respiratory system: Clear to auscultation. Respiratory effort normal. Cardiovascular system: S1 & S2 heard, RRR. No JVD, murmurs, rubs, gallops or clicks. No pedal edema. Gastrointestinal system: Abdomen is nondistended, soft, NTTP, No organomegaly or masses felt. Normal  bowel sounds heard. Central nervous system: Alert and oriented. No focal neurological deficits.  Extremities: Symmetric 5 x 5 power. Skin: No rashes, lesions or ulcers Psychiatry: Judgement and insight appear normal. Mood & affect appropriate.     Data Reviewed: I have personally reviewed following labs and imaging studies  CBC: Recent Labs  Lab 03/27/19 0600 03/28/19 0357 03/29/19 0427 03/30/19 0524 03/31/19 0519  WBC 19.5* 13.9* 12.2* 9.5 11.1*  NEUTROABS 15.8* 11.0* 9.4*  --   --   HGB 13.1 14.2 13.1 13.5 13.3  HCT 39.0 43.2 39.4 41.2 39.9  MCV 91.8 93.7 93.4 93.0 92.4  PLT 265 263 336 342 [id number]   Basic Metabolic Panel: Recent Labs  Lab 03/27/19 0600 03/28/19 0357 03/29/19 0427 03/30/19 0524 03/31/19 0519  NA 129* 134* 134* 134* 136  K 3.2* 3.3* 3.6 4.1 4.2  CL 98 103 103 101 101  CO2 20* 19* 21* 21* 26  GLUCOSE 91 64* 100* 108* 91  BUN 10 11 10  5* 6  CREATININE 0.83 0.78 0.69 0.75 0.78  CALCIUM 7.9* 8.5* 8.1* 8.3* 8.5*  MG  --  1.9  --  1.9  --    GFR: Estimated Creatinine Clearance: 112.8 mL/min (by C-G formula based on SCr of 0.78 mg/dL). Liver Function Tests: Recent Labs  Lab 03/26/19 1829 03/27/19 0600  AST 12* 13*  ALT 13 12  ALKPHOS 57 54  BILITOT 1.0 1.2  PROT 6.6 5.9*  ALBUMIN 3.3* 2.7*   Recent Labs  Lab 03/26/19 1829  LIPASE 21   No results for input(s): AMMONIA in the last 168 hours. Coagulation Profile: No results for input(s): INR, PROTIME in the last 168 hours. Cardiac Enzymes: No results for input(s): CKTOTAL, CKMB, CKMBINDEX, TROPONINI in the last 168 hours. BNP (last 3 results) No results for input(s): PROBNP in the last 8760 hours. HbA1C: No results for input(s): HGBA1C in the last 72 hours. CBG: No results for input(s): GLUCAP in the last 168 hours. Lipid Profile: No results for input(s): CHOL, HDL, LDLCALC, TRIG, CHOLHDL, LDLDIRECT in the last 72 hours. Thyroid Function Tests: No results for input(s): TSH, T4TOTAL, FREET4, T3FREE, THYROIDAB in the last 72 hours. Anemia Panel: No results for input(s): VITAMINB12,  FOLATE, FERRITIN, TIBC, IRON, RETICCTPCT in the last 72 hours. Sepsis Labs: Recent Labs  Lab 03/26/19 1835  LATICACIDVEN 0.9    Recent Results (from the past 240 hour(s))  SARS Coronavirus 2 [facility] order, Performed in [facility] hospital lab) Nasopharyngeal Nasopharyngeal Swab     Status: None   Collection Time: 03/26/19  6:57 PM   Specimen: Nasopharyngeal Swab  Result Value Ref Range Status   SARS Coronavirus 2 NEGATIVE NEGATIVE Final    Comment: (NOTE) If result is NEGATIVE SARS-CoV-2 target nucleic acids are NOT DETECTED. The SARS-CoV-2 RNA is generally detectable in upper and lower  respiratory specimens during the acute phase of infection. The lowest  concentration of SARS-CoV-2 viral copies this assay can detect is 250  copies / mL. A negative result does not preclude SARS-CoV-2 infection  and should not be used as the sole basis for treatment or other  patient management decisions.  A negative result may occur with  improper specimen collection / handling, submission of specimen other  than nasopharyngeal swab, presence of viral mutation(s) within the  areas targeted by this assay, and inadequate number of viral copies  (<250 copies / mL). A negative result must be combined with clinical  observations, patient history, and epidemiological information. If  result is POSITIVE SARS-CoV-2 target nucleic acids are DETECTED. The SARS-CoV-2 RNA is generally detectable in upper and lower  respiratory specimens dur ing the acute phase of infection.  Positive  results are indicative of active infection with SARS-CoV-2.  Clinical  correlation with patient history and other diagnostic information is  necessary to determine patient infection status.  Positive results do  not rule out bacterial infection or co-infection with other viruses. If result is PRESUMPTIVE POSTIVE SARS-CoV-2 nucleic acids MAY BE PRESENT.   A presumptive positive result was obtained on the submitted specimen   and confirmed on repeat testing.  While 2019 novel coronavirus  (SARS-CoV-2) nucleic acids may be present in the submitted sample  additional confirmatory testing may be necessary for epidemiological  and / or clinical management purposes  to differentiate between  SARS-CoV-2 and other Sarbecovirus currently known to infect humans.  If clinically indicated additional testing with an alternate test  methodology [phone]) is advised. The SARS-CoV-2 RNA is generally  detectable in upper and lower respiratory sp ecimens during the acute  phase of infection. The expected result is Negative. Fact Sheet for Patients:  StrictlyIdeas.no Fact Sheet for Healthcare Providers: BankingDealers.co.za This test is not yet approved or cleared by the [place] FDA and has been authorized for detection and/or diagnosis of SARS-CoV-2 by FDA under an Emergency Use Authorization (EUA).  This EUA will remain in effect (meaning this test can be used) for the duration of the COVID-19 declaration under Section 564(b)(1) of the Act, 21 U.S.C. section 360bbb-3(b)(1), unless the authorization is terminated or revoked sooner. Performed at [facility], [place] [address], [place], [place] [address]   Blood Culture (routine x 2)     Status: None   Collection Time: 03/26/19  6:58 PM   Specimen: BLOOD  Result Value Ref Range Status   Specimen Description BLOOD LEFT ANTECUBITAL  Final   Special Requests   Final    BOTTLES DRAWN AEROBIC AND ANAEROBIC Blood Culture adequate volume   Culture   Final    NO GROWTH 5 DAYS Performed at [facility], [place] [address] [place], [place] [address]    Report Status 03/31/2019 FINAL  Final  Blood Culture (routine x 2)     Status: None   Collection Time: 03/26/19  7:46 PM   Specimen: BLOOD RIGHT FOREARM  Result Value Ref Range Status   Specimen Description BLOOD RIGHT FOREARM  Final   Special Requests   Final     BOTTLES DRAWN AEROBIC AND ANAEROBIC Blood Culture results may not be optimal due to an inadequate volume of blood received in culture bottles   Culture   Final    NO GROWTH 5 DAYS Performed at [facility], [place] [address] [place], [place] [address]    Report Status 03/31/2019 FINAL  Final  Urine culture     Status: None   Collection Time: 03/26/19  9:37 PM   Specimen: In/Out Cath Urine  Result Value Ref Range Status   Specimen Description IN/OUT CATH URINE  Final   Special Requests NONE  Final   Culture   Final    NO GROWTH Performed at [facility], [place] [address], [place], [place] [address]    Report Status 03/27/2019 FINAL  Final         Radiology Studies: Ct Abdomen Pelvis W Contrast  Result Date: 03/31/2019 CLINICAL DATA:  Follow-up diverticulitis EXAM: CT ABDOMEN AND PELVIS WITH CONTRAST TECHNIQUE: Multidetector  CT imaging of the abdomen and pelvis was performed using the standard protocol following bolus administration of intravenous contrast. CONTRAST:  123mL OMNIPAQUE IOHEXOL 300 MG/ML SOLN, additional oral enteric contrast COMPARISON:  03/26/2019 FINDINGS: Lower chest: Small bilateral pleural effusions and associated atelectasis or consolidation. Hepatobiliary: No solid liver abnormality is seen. Hepatic steatosis. No gallstones, gallbladder wall thickening, or biliary dilatation. Pancreas: Unremarkable. No pancreatic ductal dilatation or surrounding inflammatory changes. Spleen: Normal in size without significant abnormality. Adrenals/Urinary Tract: Adrenal glands are unremarkable. Kidneys are normal, without renal calculi, solid lesion, or hydronephrosis. Bladder is unremarkable. Stomach/Bowel: Stomach is within normal limits. Descending and sigmoid diverticulosis with extensive thickening of the mid to distal sigmoid and a large abscess overlying left inguinal vessels measuring 5.7 x 2.7 cm (series 3, image 70). This is substantially enlarged compared to prior  examination at which time it measured 4.1 x 2.1 cm when measured similarly. There is an additional intramural component of abscess along the left aspect of the distal sigmoid measuring 1.5 cm (series 3, image 71), which is similar to prior examination. Vascular/Lymphatic: No significant vascular findings are present. No enlarged abdominal or pelvic lymph nodes. Reproductive: No mass or other significant abnormality. Other: Small fat containing bilateral inguinal hernias. No abdominopelvic ascites. Musculoskeletal: No acute or significant osseous findings. IMPRESSION: 1. Descending and sigmoid diverticulosis with extensive thickening of the mid to distal sigmoid and a large abscess overlying left inguinal vessels measuring 5.7 x 2.7 cm (series 3, image 70). This is substantially enlarged compared to prior examination at which time it measured 4.1 x 2.1 cm when measured similarly. There is an additional intramural component of abscess along the left aspect of the distal sigmoid measuring 1.5 cm (series 3, image 71), which is similar to prior examination. Overall findings remain consistent with acute diverticulitis complicated by abscess. No evidence of overt perforation. 2.  Other chronic and incidental findings as detailed above. Electronically Signed   By: Eddie Candle M.D.   On: 03/31/2019 13:51        Scheduled Meds: . amoxicillin-clavulanate  1 tablet Oral Q12H  . busPIRone  5 mg Oral BID  . citalopram  20 mg Oral Daily  . enoxaparin (LOVENOX) injection  40 mg Subcutaneous Daily  . fenofibrate  160 mg Oral Daily  . lisinopril  20 mg Oral Daily  . traZODone  50 mg Oral QHS   Continuous Infusions: . sodium chloride 1,000 mL (03/28/19 2256)     LOS: 5 days    Time spent: 32 minutes spent on chart review, discussion with nursing staff, personally reviewing all imaging studies and labs, consultants, updating family and interview/physical exam; more than 50% of that time was spent in counseling  and/or coordination of care.    Eric J [place] DO [facility] Pager [phone]  If 7PM-7AM, please contact night-coverage [email or web address] Password [email or web address] 03/31/2019, 3:00 PM

## 2019-04-01 ENCOUNTER — Inpatient Hospital Stay (HOSPITAL_COMMUNITY): Payer: Commercial Managed Care - PPO

## 2019-04-01 LAB — CBC
HCT: 41 % (ref 39.0–52.0)
Hemoglobin: 13.8 g/dL (ref 13.0–17.0)
MCH: 30.8 pg (ref 26.0–34.0)
MCHC: 33.7 g/dL (ref 30.0–36.0)
MCV: 91.5 fL (ref 80.0–100.0)
Platelets: 394 10*3/uL (ref 150–400)
RBC: 4.48 MIL/uL (ref 4.22–5.81)
RDW: 14.1 % (ref 11.5–15.5)
WBC: 12.3 10*3/uL — ABNORMAL HIGH (ref 4.0–10.5)
nRBC: 0 % (ref 0.0–0.2)

## 2019-04-01 MED ORDER — FENTANYL CITRATE (PF) 100 MCG/2ML IJ SOLN
INTRAMUSCULAR | Status: AC | PRN
  Administered 2019-04-01: 25 ug via INTRAVENOUS
  Administered 2019-04-01 (×2): 50 ug via INTRAVENOUS

## 2019-04-01 MED ORDER — MIDAZOLAM HCL 2 MG/2ML IJ SOLN
INTRAMUSCULAR | Status: AC | PRN
  Administered 2019-04-01: 1 mg via INTRAVENOUS
  Administered 2019-04-01: 0.5 mg via INTRAVENOUS
  Administered 2019-04-01: 1 mg via INTRAVENOUS

## 2019-04-01 MED ORDER — MIDAZOLAM HCL 2 MG/2ML IJ SOLN
INTRAMUSCULAR | Status: AC
  Filled 2019-04-01: qty 2

## 2019-04-01 MED ORDER — FENTANYL CITRATE (PF) 100 MCG/2ML IJ SOLN
INTRAMUSCULAR | Status: AC
  Filled 2019-04-01: qty 2

## 2019-04-01 MED ORDER — SODIUM CHLORIDE 0.9% FLUSH
5.0000 mL | Freq: Three times a day (TID) | INTRAVENOUS | Status: DC
  Administered 2019-04-01 – 2019-04-02 (×3): 5 mL

## 2019-04-01 MED ORDER — FENTANYL CITRATE (PF) 100 MCG/2ML IJ SOLN
INTRAMUSCULAR | Status: AC
  Filled 2019-04-01: qty 4

## 2019-04-01 MED ORDER — MIDAZOLAM HCL 2 MG/2ML IJ SOLN
INTRAMUSCULAR | Status: AC
  Filled 2019-04-01: qty 4

## 2019-04-01 MED ORDER — LIDOCAINE HCL 1 % IJ SOLN
INTRAMUSCULAR | Status: AC
  Filled 2019-04-01: qty 20

## 2019-04-01 NOTE — Progress Notes (Signed)
PROGRESS NOTE    Victor Boyer  [id number] DOB: [birth year] DOA: 03/26/2019 PCP: Patient, No Pcp Per    Brief Narrative:   Victor Boyer is a 53 year old male with history of hypertension, hyperlipidemia, depression, anxiety who presented with abdominal pain in the left lower quadrant.  In the emergency department he was found to be febrile.  CT abdomen/pelvis showed sigmoid diverticulitis with possible microperforation and fluid collection.  General surgery consulted.  Started on conservative management.   Assessment & Plan:   Principal Problem:   Acute diverticulitis Active Problems:   Major depressive disorder, single episode, severe (HCC)   SIRS (systemic inflammatory response syndrome) (HCC)   Essential hypertension   Sigmoid diverticulitis:  Patient presenting with progressive left lower quadrant pain.  CT abdomen/pelvis notable for sigmoid diverticulitis with possible microperforation and fluid collection.  IR consulted, no significant fluid collection for aspiration/drainage.  General surgery consulted and recommended conservative management with IV antibiotics and pain control. --WBC 19.5-->13.9-->12.2-->9.5-->11.1-->12.3 --Urine culture: No growth --Blood cultures x2: No growth x5 days --Zosyn transitioned to Augmentin on 9/3, plan total course of 14 days (E: 04/09/2019) --repeat CT A/P with descending/sigmoid diverticulosis with extensive thickening with large abscess with interval enlargement since previous CT scan --IR placed drain 9/5, f/u fluid cultures --Pain control with oxycodone IR prn --Will need colonoscopy outpatient in 6-8 weeks; no prior colonoscopies in the past  Hypertension:  Currently blood pressure stable.   --Lisinopril 20 mg p.o. daily Continue to monitor blood pressure.  Continue PRN meds  Hyperlipidemia: continue fenofibrate .  History of anxiety/depression: Continue home Celexa, BuSpar and trazodone at home.  Hypokalemia:  Repleted  --Continue to monitor electrolytes to include magnesium daily   DVT prophylaxis: Lovenox Code Status: Full code Family Communication: None Disposition Plan: Continue inpatient, for IR drain placement today, discharge pending general surgery clearance   Consultants:   General surgery  Procedures:   None  Antimicrobials:   Zosyn 8/30 - 9/3  Augmentin 9/3>> (projected end date 04/09/2019)   Subjective: Patient seen and examined at bedside, resting comfortably.  Denies any abdominal pain.  Awaiting IR drain placement this morning for interval enlargement of diverticular abscess.  Denies headache, no fever/chills/night sweats, no nausea vomiting, no chest pain, no palpitations, no weakness, no issues with bladder function, no paresthesias.  No acute events overnight per nursing staff.  Objective: Vitals:   04/01/19 1025 04/01/19 1030 04/01/19 1046 04/01/19 1056  BP: 121/80 125/77 121/76 104/69  Pulse: (!) 58 (!) 57 (!) 56 67  Resp: 19 (!) 21 20 20   Temp:    97.8 F (36.6 C)  TempSrc:    Oral  SpO2: 94% 93% 91% 93%  Weight:      Height:        Intake/Output Summary (Last 24 hours) at 04/01/2019 1121 Last data filed at 04/01/2019 1037 Gross per 24 hour  Intake 963 ml  Output 2275 ml  Net -1312 ml   Filed Weights   03/30/19 0829 03/31/19 0435 04/01/19 0535  Weight: 88.3 kg 86.6 kg 85 kg    Examination:  General exam: Appears calm and comfortable  Respiratory system: Clear to auscultation. Respiratory effort normal. Cardiovascular system: S1 & S2 heard, RRR. No JVD, murmurs, rubs, gallops or clicks. No pedal edema. Gastrointestinal system: Abdomen is nondistended, soft, NTTP, No organomegaly or masses felt. Normal bowel sounds heard. Central nervous system: Alert and oriented. No focal neurological deficits. Extremities: Symmetric 5 x 5 power. Skin: No rashes, lesions or ulcers Psychiatry:  Judgement and insight appear normal. Mood & affect appropriate.     Data  Reviewed: I have personally reviewed following labs and imaging studies  CBC: Recent Labs  Lab 03/27/19 0600 03/28/19 0357 03/29/19 0427 03/30/19 0524 03/31/19 0519 04/01/19 0545  WBC 19.5* 13.9* 12.2* 9.5 11.1* 12.3*  NEUTROABS 15.8* 11.0* 9.4*  --   --   --   HGB 13.1 14.2 13.1 13.5 13.3 13.8  HCT 39.0 43.2 39.4 41.2 39.9 41.0  MCV 91.8 93.7 93.4 93.0 92.4 91.5  PLT 265 263 336 342 377 [id number]   Basic Metabolic Panel: Recent Labs  Lab 03/27/19 0600 03/28/19 0357 03/29/19 0427 03/30/19 0524 03/31/19 0519  NA 129* 134* 134* 134* 136  K 3.2* 3.3* 3.6 4.1 4.2  CL 98 103 103 101 101  CO2 20* 19* 21* 21* 26  GLUCOSE 91 64* 100* 108* 91  BUN 10 11 10  5* 6  CREATININE 0.83 0.78 0.69 0.75 0.78  CALCIUM 7.9* 8.5* 8.1* 8.3* 8.5*  MG  --  1.9  --  1.9  --    GFR: Estimated Creatinine Clearance: 112.8 mL/min (by C-G formula based on SCr of 0.78 mg/dL). Liver Function Tests: Recent Labs  Lab 03/26/19 1829 03/27/19 0600  AST 12* 13*  ALT 13 12  ALKPHOS 57 54  BILITOT 1.0 1.2  PROT 6.6 5.9*  ALBUMIN 3.3* 2.7*   Recent Labs  Lab 03/26/19 1829  LIPASE 21   No results for input(s): AMMONIA in the last 168 hours. Coagulation Profile: No results for input(s): INR, PROTIME in the last 168 hours. Cardiac Enzymes: No results for input(s): CKTOTAL, CKMB, CKMBINDEX, TROPONINI in the last 168 hours. BNP (last 3 results) No results for input(s): PROBNP in the last 8760 hours. HbA1C: No results for input(s): HGBA1C in the last 72 hours. CBG: No results for input(s): GLUCAP in the last 168 hours. Lipid Profile: No results for input(s): CHOL, HDL, LDLCALC, TRIG, CHOLHDL, LDLDIRECT in the last 72 hours. Thyroid Function Tests: No results for input(s): TSH, T4TOTAL, FREET4, T3FREE, THYROIDAB in the last 72 hours. Anemia Panel: No results for input(s): VITAMINB12, FOLATE, FERRITIN, TIBC, IRON, RETICCTPCT in the last 72 hours. Sepsis Labs: Recent Labs  Lab 03/26/19 1835   LATICACIDVEN 0.9    Recent Results (from the past 240 hour(s))  SARS Coronavirus 2 [facility] order, Performed in [facility] hospital lab) Nasopharyngeal Nasopharyngeal Swab     Status: None   Collection Time: 03/26/19  6:57 PM   Specimen: Nasopharyngeal Swab  Result Value Ref Range Status   SARS Coronavirus 2 NEGATIVE NEGATIVE Final    Comment: (NOTE) If result is NEGATIVE SARS-CoV-2 target nucleic acids are NOT DETECTED. The SARS-CoV-2 RNA is generally detectable in upper and lower  respiratory specimens during the acute phase of infection. The lowest  concentration of SARS-CoV-2 viral copies this assay can detect is 250  copies / mL. A negative result does not preclude SARS-CoV-2 infection  and should not be used as the sole basis for treatment or other  patient management decisions.  A negative result may occur with  improper specimen collection / handling, submission of specimen other  than nasopharyngeal swab, presence of viral mutation(s) within the  areas targeted by this assay, and inadequate number of viral copies  (<250 copies / mL). A negative result must be combined with clinical  observations, patient history, and epidemiological information. If result is POSITIVE SARS-CoV-2 target nucleic acids are DETECTED. The SARS-CoV-2 RNA is generally detectable in  upper and lower  respiratory specimens dur ing the acute phase of infection.  Positive  results are indicative of active infection with SARS-CoV-2.  Clinical  correlation with patient history and other diagnostic information is  necessary to determine patient infection status.  Positive results do  not rule out bacterial infection or co-infection with other viruses. If result is PRESUMPTIVE POSTIVE SARS-CoV-2 nucleic acids MAY BE PRESENT.   A presumptive positive result was obtained on the submitted specimen  and confirmed on repeat testing.  While 2019 novel coronavirus  (SARS-CoV-2) nucleic acids may be present in  the submitted sample  additional confirmatory testing may be necessary for epidemiological  and / or clinical management purposes  to differentiate between  SARS-CoV-2 and other Sarbecovirus currently known to infect humans.  If clinically indicated additional testing with an alternate test  methodology [phone]) is advised. The SARS-CoV-2 RNA is generally  detectable in upper and lower respiratory sp ecimens during the acute  phase of infection. The expected result is Negative. Fact Sheet for Patients:  StrictlyIdeas.no Fact Sheet for Healthcare Providers: BankingDealers.co.za This test is not yet approved or cleared by the [place] FDA and has been authorized for detection and/or diagnosis of SARS-CoV-2 by FDA under an Emergency Use Authorization (EUA).  This EUA will remain in effect (meaning this test can be used) for the duration of the COVID-19 declaration under Section 564(b)(1) of the Act, 21 U.S.C. section 360bbb-3(b)(1), unless the authorization is terminated or revoked sooner. Performed at [facility], [place] [address], [place], [place] [address]   Blood Culture (routine x 2)     Status: None   Collection Time: 03/26/19  6:58 PM   Specimen: BLOOD  Result Value Ref Range Status   Specimen Description BLOOD LEFT ANTECUBITAL  Final   Special Requests   Final    BOTTLES DRAWN AEROBIC AND ANAEROBIC Blood Culture adequate volume   Culture   Final    NO GROWTH 5 DAYS Performed at [facility], [place] [address], [place], [place] [address]    Report Status 03/31/2019 FINAL  Final  Blood Culture (routine x 2)     Status: None   Collection Time: 03/26/19  7:46 PM   Specimen: BLOOD RIGHT FOREARM  Result Value Ref Range Status   Specimen Description BLOOD RIGHT FOREARM  Final   Special Requests   Final    BOTTLES DRAWN AEROBIC AND ANAEROBIC Blood Culture results may not be optimal due to an inadequate volume of blood  received in culture bottles   Culture   Final    NO GROWTH 5 DAYS Performed at [facility], [place] [address] [place], [place] [address]    Report Status 03/31/2019 FINAL  Final  Urine culture     Status: None   Collection Time: 03/26/19  9:37 PM   Specimen: In/Out Cath Urine  Result Value Ref Range Status   Specimen Description IN/OUT CATH URINE  Final   Special Requests NONE  Final   Culture   Final    NO GROWTH Performed at [facility], [place] [address], [place], [place] [address]    Report Status 03/27/2019 FINAL  Final         Radiology Studies: Ct Abdomen Pelvis W Contrast  Result Date: 03/31/2019 CLINICAL DATA:  Follow-up diverticulitis EXAM: CT ABDOMEN AND PELVIS WITH CONTRAST TECHNIQUE: Multidetector CT imaging of the abdomen and pelvis was performed using the standard protocol following bolus administration  of intravenous contrast. CONTRAST:  158mL OMNIPAQUE IOHEXOL 300 MG/ML SOLN, additional oral enteric contrast COMPARISON:  03/26/2019 FINDINGS: Lower chest: Small bilateral pleural effusions and associated atelectasis or consolidation. Hepatobiliary: No solid liver abnormality is seen. Hepatic steatosis. No gallstones, gallbladder wall thickening, or biliary dilatation. Pancreas: Unremarkable. No pancreatic ductal dilatation or surrounding inflammatory changes. Spleen: Normal in size without significant abnormality. Adrenals/Urinary Tract: Adrenal glands are unremarkable. Kidneys are normal, without renal calculi, solid lesion, or hydronephrosis. Bladder is unremarkable. Stomach/Bowel: Stomach is within normal limits. Descending and sigmoid diverticulosis with extensive thickening of the mid to distal sigmoid and a large abscess overlying left inguinal vessels measuring 5.7 x 2.7 cm (series 3, image 70). This is substantially enlarged compared to prior examination at which time it measured 4.1 x 2.1 cm when measured similarly. There is an additional intramural component  of abscess along the left aspect of the distal sigmoid measuring 1.5 cm (series 3, image 71), which is similar to prior examination. Vascular/Lymphatic: No significant vascular findings are present. No enlarged abdominal or pelvic lymph nodes. Reproductive: No mass or other significant abnormality. Other: Small fat containing bilateral inguinal hernias. No abdominopelvic ascites. Musculoskeletal: No acute or significant osseous findings. IMPRESSION: 1. Descending and sigmoid diverticulosis with extensive thickening of the mid to distal sigmoid and a large abscess overlying left inguinal vessels measuring 5.7 x 2.7 cm (series 3, image 70). This is substantially enlarged compared to prior examination at which time it measured 4.1 x 2.1 cm when measured similarly. There is an additional intramural component of abscess along the left aspect of the distal sigmoid measuring 1.5 cm (series 3, image 71), which is similar to prior examination. Overall findings remain consistent with acute diverticulitis complicated by abscess. No evidence of overt perforation. 2.  Other chronic and incidental findings as detailed above. Electronically Signed   By: Eddie Candle M.D.   On: 03/31/2019 13:51        Scheduled Meds: . fentaNYL      . fentaNYL      . midazolam      . midazolam      . amoxicillin-clavulanate  1 tablet Oral Q12H  . busPIRone  5 mg Oral BID  . citalopram  20 mg Oral Daily  . fenofibrate  160 mg Oral Daily  . lidocaine      . lisinopril  20 mg Oral Daily  . traZODone  50 mg Oral QHS   Continuous Infusions: . sodium chloride 1,000 mL (03/28/19 2256)     LOS: 6 days    Time spent: 32 minutes spent on chart review, discussion with nursing staff, personally reviewing all imaging studies and labs, consultants, updating family and interview/physical exam; more than 50% of that time was spent in counseling and/or coordination of care.    Firmin Belisle J [place] DO [facility] Pager [phone]   If 7PM-7AM, please contact night-coverage [email or web address] Password [email or web address] 04/01/2019, 11:21 AM

## 2019-04-01 NOTE — Procedures (Signed)
Interventional Radiology Procedure:   Indications: Diverticular abscess  Procedure: CT guided abscess drainage  Findings: 20 ml of thick purulent fluid removed.  10 Fr drain placed.  Complications: None     EBL: less than 10 ml  Plan: Send fluid for culture.  Follow drain output.    Edd Reppert R. Anselm Pancoast, MD  Pager: [phone]

## 2019-04-02 ENCOUNTER — Other Ambulatory Visit: Payer: Self-pay | Admitting: Internal Medicine

## 2019-04-02 ENCOUNTER — Telehealth: Payer: Self-pay | Admitting: Internal Medicine

## 2019-04-02 DIAGNOSIS — K5720 Diverticulitis of large intestine with perforation and abscess without bleeding: Secondary | ICD-10-CM

## 2019-04-02 LAB — CBC
HCT: 43.1 % (ref 39.0–52.0)
Hemoglobin: 14 g/dL (ref 13.0–17.0)
MCH: 30 pg (ref 26.0–34.0)
MCHC: 32.5 g/dL (ref 30.0–36.0)
MCV: 92.5 fL (ref 80.0–100.0)
Platelets: 403 10*3/uL — ABNORMAL HIGH (ref 150–400)
RBC: 4.66 MIL/uL (ref 4.22–5.81)
RDW: 14 % (ref 11.5–15.5)
WBC: 11.3 10*3/uL — ABNORMAL HIGH (ref 4.0–10.5)
nRBC: 0 % (ref 0.0–0.2)

## 2019-04-02 LAB — BASIC METABOLIC PANEL
Anion gap: 11 (ref 5–15)
BUN: 14 mg/dL (ref 6–20)
CO2: 25 mmol/L (ref 22–32)
Calcium: 8.7 mg/dL — ABNORMAL LOW (ref 8.9–10.3)
Chloride: 98 mmol/L (ref 98–111)
Creatinine, Ser: 0.86 mg/dL (ref 0.61–1.24)
GFR calc Af Amer: >60 mL/min (ref >60)
GFR calc non Af Amer: >60 mL/min (ref >60)
Glucose, Bld: 106 mg/dL — ABNORMAL HIGH (ref 70–99)
Potassium: 4.3 mmol/L (ref 3.5–5.1)
Sodium: 134 mmol/L — ABNORMAL LOW (ref 135–145)

## 2019-04-02 MED ORDER — OXYCODONE HCL 5 MG PO TABS: 5.0000 mg | ORAL_TABLET | Freq: Four times a day (QID) | ORAL | 0 refills | Status: AC | PRN

## 2019-04-02 MED ORDER — AMOXICILLIN-POT CLAVULANATE 875-125 MG PO TABS: 1.0000 | ORAL_TABLET | Freq: Two times a day (BID) | ORAL | 0 refills | Status: AC

## 2019-04-02 MED ORDER — "GAUZE DRESSING 4""X4"" PADS": MEDICATED_PAD | 0 refills | Status: AC

## 2019-04-02 MED ORDER — SODIUM CHLORIDE FLUSH 0.9 % IV SOLN: 10.0000 mL | Freq: Three times a day (TID) | INTRAVENOUS | 0 refills | Status: AC

## 2019-04-02 MED ORDER — "CVS ADHESIVE TAPE 1.5""X10YD TAPE": MEDICATED_TAPE | 0 refills | Status: AC

## 2019-04-02 NOTE — Progress Notes (Signed)
Patient ID: Victor Boyer, male   DOB: May 18, 1966, 53 y.o.   MRN: [id number]   Diverticular abscess drain placed in IR yesterday-- Dr Anselm Pancoast  Pt will need to flush drain daily with 5-10 cc sterile saline (will need Rx for flushes at DC) Record Output daily  IR will call pt with time and date of follow up as outpatient  Orders in for same

## 2019-04-02 NOTE — Discharge Summary (Signed)
Physician Discharge Summary  Victor Boyer [id number] DOB: [birth year] DOA: 03/26/2019  PCP: Patient, No Pcp Per  Admit date: 03/26/2019 Discharge date: 04/02/2019  Admitted From: Home Disposition:  Home  Recommendations for Outpatient Follow-up:  1. Follow up with PCP in 1-2 weeks 2. Follow-up with general surgery, Dr. Dema Severin as scheduled 3. Follow-up with [facility] radiology for further management of percutaneous drain 4. Please obtain BMP/CBC in one week 5. Please follow up on the following pending results: Intra-abdominal culture  Home Health: Yes, RN for wound assessment/management with percutaneous drain in place Equipment/Devices: Intra-abdominal percutaneous drain  Discharge Condition: Stable CODE STATUS: Full code Diet recommendation: Heart Healthy   History of present illness:  Victor Boyer is a 53 year old male with history of hypertension, hyperlipidemia, depression, anxiety who presented with abdominal pain in the left lower quadrant. In the emergency department he was found to be febrile. CT abdomen/pelvis showed sigmoid diverticulitis with possible microperforation and fluid collection. General surgery consulted. Started on conservative management.  Hospital course:  Sigmoid diverticulitis:  Patient presenting with progressive left lower quadrant pain.  CT abdomen/pelvis notable for sigmoid diverticulitis with possible microperforation and fluid collection.  IR consulted, no significant fluid collection for aspiration/drainage.  General surgery consulted and recommended conservative management with IV antibiotics and pain control.  Urine culture and blood culture showed no growth.  Patient's white count initially improved, but unfortunately on 03/31/2019, started to rise once again.  Repeat CT abdomen/pelvis notable for interval enlargement of his diverticular abscess.  General surgery recommended IR drain which was placed on 04/01/2019.  Culture from the intra-abdominal  abscess is currently pending at time of discharge.  General surgery okay for discharge home with outpatient follow-up with IR/general surgery for further drain management.  General surgery recommended complete a 14-day course of antibiotics.  We will continue on Augmentin 875-125 mg p.o. twice daily for an additional 8 days.  Hypertension: Currently blood pressure stable. Continue home lisinopril/HCTZ.  Hyperlipidemia: continue fenofibrate.  History of anxiety/depression: Continue home Celexa, BuSpar and trazodone  Hypokalemia: Repleted during hospitalization, now stable.   Discharge Diagnoses:  Principal Problem:   Acute diverticulitis Active Problems:   Major depressive disorder, single episode, severe (HCC)   SIRS (systemic inflammatory response syndrome) ([place])   Essential hypertension    Discharge Instructions  Discharge Instructions    Call MD for:  difficulty breathing, headache or visual disturbances   Complete by: As directed    Call MD for:  extreme fatigue   Complete by: As directed    Call MD for:  persistant dizziness or light-headedness   Complete by: As directed    Call MD for:  persistant nausea and vomiting   Complete by: As directed    Call MD for:  redness, tenderness, or signs of infection (pain, swelling, redness, odor or green/yellow discharge around incision site)   Complete by: As directed    Call MD for:  severe uncontrolled pain   Complete by: As directed    Call MD for:  temperature >100.4   Complete by: As directed    Diet - low sodium heart healthy   Complete by: As directed    Increase activity slowly   Complete by: As directed      Allergies as of 04/02/2019   No Known Allergies     Medication List    TAKE these medications   acetaminophen 500 MG tablet Commonly known as: TYLENOL Take 500 mg by mouth every 6 (six) hours as needed.  amoxicillin-clavulanate 875-125 MG tablet Commonly known as: AUGMENTIN Take 1 tablet by mouth  every 12 (twelve) hours for 8 days.   busPIRone 5 MG tablet Commonly known as: BUSPAR Take 5 mg by mouth 2 (two) times daily.   citalopram 20 MG tablet Commonly known as: CELEXA Take 1 tablet (20 mg total) by mouth daily.   [employer] Adhesive Tape 1.5"x10yd Tape Use to secure dressing daily   fenofibrate 160 MG tablet Take 160 mg by mouth daily. with food   Gauze Dressing 4"X4" Pads Spit gauze and place at drain site daily.   lisinopril-hydrochlorothiazide 20-25 MG tablet Commonly known as: ZESTORETIC Take 1 tablet by mouth daily.   multivitamin with minerals Tabs tablet Take 1 tablet by mouth daily.   sodium chloride flush 0.9 % Soln injection Place 10 mLs into feeding tube every 8 (eight) hours.   traZODone 50 MG tablet Commonly known as: DESYREL Take 1 tablet (50 mg total) by mouth at bedtime as needed for sleep.      Follow-up Information    Ileana Roup, MD Follow up.   Specialty: General Surgery Contact information: [place] [address] [phone]        Camille Bal, PA-C. Go on 04/07/2019.   Specialty: Physician Assistant Why: please make follow up apt@[email or web address] :15am Contact information: [place] [address] [phone]          No Known Allergies  Consultations:  General surgery  Interventional radiology   Procedures/Studies: Ct Abdomen Pelvis W Contrast  Result Date: 03/31/2019 CLINICAL DATA:  Follow-up diverticulitis EXAM: CT ABDOMEN AND PELVIS WITH CONTRAST TECHNIQUE: Multidetector CT imaging of the abdomen and pelvis was performed using the standard protocol following bolus administration of intravenous contrast. CONTRAST:  137mL OMNIPAQUE IOHEXOL 300 MG/ML SOLN, additional oral enteric contrast COMPARISON:  03/26/2019 FINDINGS: Lower chest: Small bilateral pleural effusions and associated atelectasis or consolidation. Hepatobiliary: No solid liver abnormality is seen. Hepatic steatosis. No gallstones,  gallbladder wall thickening, or biliary dilatation. Pancreas: Unremarkable. No pancreatic ductal dilatation or surrounding inflammatory changes. Spleen: Normal in size without significant abnormality. Adrenals/Urinary Tract: Adrenal glands are unremarkable. Kidneys are normal, without renal calculi, solid lesion, or hydronephrosis. Bladder is unremarkable. Stomach/Bowel: Stomach is within normal limits. Descending and sigmoid diverticulosis with extensive thickening of the mid to distal sigmoid and a large abscess overlying left inguinal vessels measuring 5.7 x 2.7 cm (series 3, image 70). This is substantially enlarged compared to prior examination at which time it measured 4.1 x 2.1 cm when measured similarly. There is an additional intramural component of abscess along the left aspect of the distal sigmoid measuring 1.5 cm (series 3, image 71), which is similar to prior examination. Vascular/Lymphatic: No significant vascular findings are present. No enlarged abdominal or pelvic lymph nodes. Reproductive: No mass or other significant abnormality. Other: Small fat containing bilateral inguinal hernias. No abdominopelvic ascites. Musculoskeletal: No acute or significant osseous findings. IMPRESSION: 1. Descending and sigmoid diverticulosis with extensive thickening of the mid to distal sigmoid and a large abscess overlying left inguinal vessels measuring 5.7 x 2.7 cm (series 3, image 70). This is substantially enlarged compared to prior examination at which time it measured 4.1 x 2.1 cm when measured similarly. There is an additional intramural component of abscess along the left aspect of the distal sigmoid measuring 1.5 cm (series 3, image 71), which is similar to prior examination. Overall findings remain consistent with acute diverticulitis complicated by abscess. No  evidence of overt perforation. 2.  Other chronic and incidental findings as detailed above. Electronically Signed   By: Eddie Candle M.D.   On:  03/31/2019 13:51   Ct Abdomen Pelvis W Contrast  Result Date: 03/26/2019 CLINICAL DATA:  Abdominal pain with diverticulitis suspected. EXAM: CT ABDOMEN AND PELVIS WITH CONTRAST TECHNIQUE: Multidetector CT imaging of the abdomen and pelvis was performed using the standard protocol following bolus administration of intravenous contrast. CONTRAST:  127mL OMNIPAQUE IOHEXOL 300 MG/ML  SOLN COMPARISON:  None. FINDINGS: Lower chest: There is atelectasis at the lung bases.The heart size is normal. Hepatobiliary: There is decreased hepatic attenuation suggestive of hepatic steatosis. Normal gallbladder.There is no biliary ductal dilation. Pancreas: Normal contours without ductal dilatation. No peripancreatic fluid collection. Spleen: No splenic laceration or hematoma. Adrenals/Urinary Tract: --Adrenal glands: No adrenal hemorrhage. --Right kidney/ureter: No hydronephrosis or perinephric hematoma. --Left kidney/ureter: No hydronephrosis or perinephric hematoma. --Urinary bladder: There is some asymmetric bladder wall thickening which is presumably reactive. Stomach/Bowel: --Stomach/Duodenum: No hiatal hernia or other gastric abnormality. Normal duodenal course and caliber. --Small bowel: No dilatation or inflammation. --Colon: There is sigmoid diverticulitis. There is an adjacent fluid collection measuring 4.1 x 2.1 cm. There are scattered colonic diverticula. --Appendix: Not visualized. No right lower quadrant inflammation or free fluid. Vascular/Lymphatic: Normal course and caliber of the major abdominal vessels. There is a circumaortic left renal vein, a normal variant. --No retroperitoneal lymphadenopathy. --No mesenteric lymphadenopathy. --No pelvic or inguinal lymphadenopathy. Reproductive: Unremarkable Other: There is a trace amount of extraluminal free air. There is reactive free fluid in the left pericolic gutter. There are bilateral fat containing inguinal hernias. Musculoskeletal. No acute displaced fractures.  IMPRESSION: 1. Findings consistent with sigmoid diverticulitis. There is an adjacent 4.1 x 2.1 cm fluid collection which may represent a collection of reactive free fluid. A developing abscess is not excluded. There is a trace amount of extraluminal free air adjacent to this fluid collection, best visualized on the coronal view. 2. Hepatic steatosis. 3. Asymmetric bladder wall thickening, presumably reactive. Electronically Signed   By: Constance Holster M.D.   On: 03/26/2019 21:02   Dg Chest Port 1 View  Result Date: 03/26/2019 CLINICAL DATA:  Fever EXAM: PORTABLE CHEST 1 VIEW COMPARISON:  None. FINDINGS: This is a mildly low volume film. The cardiomediastinal silhouette is unremarkable. Peribronchial thickening noted. There is no evidence of focal airspace disease, pulmonary edema, suspicious pulmonary nodule/mass, pleural effusion, or pneumothorax. No acute bony abnormalities are identified. IMPRESSION: Peribronchial thickening without evidence of focal airspace disease. Electronically Signed   By: Margarette Canada M.D.   On: 03/26/2019 19:44   Ct Image Guided Drainage By Percutaneous Catheter  Result Date: 04/01/2019 INDICATION: 53 year old with a diverticular abscess in the left pelvis. EXAM: CT GUIDED DRAINAGE OF LEFT PELVIC ABSCESS MEDICATIONS: The patient is currently admitted to the hospital and receiving intravenous antibiotics. ANESTHESIA/SEDATION: 2.5 mg IV Versed 125 mcg IV Fentanyl Moderate Sedation Time:  20 minutes The patient was continuously monitored during the procedure by the interventional radiology nurse under my direct supervision. COMPLICATIONS: None immediate. TECHNIQUE: Informed written consent was obtained from the patient after a thorough discussion of the procedural risks, benefits and alternatives. All questions were addressed. Maximal Sterile Barrier Technique was utilized including caps, mask, sterile gowns, sterile gloves, sterile drape, hand hygiene and skin antiseptic. A  timeout was performed prior to the initiation of the procedure. PROCEDURE: CT images were obtained through the lower abdomen and pelvis. The abscess in the left upper  pelvic region was targeted for drainage. Left side of the abdomen and pelvis was prepped with chlorhexidine and sterile field was created. Skin and soft tissues were anesthetized with 1% lidocaine. Using CT guidance, 18 gauge trocar needle was directed into the abscess. Thick purulent fluid was aspirated. Stiff Amplatz wire was advanced into the collection and the tract was dilated to accommodate a 10.2 [place] multipurpose drain. 20 mL of thick purulent fluid was removed. Catheter was sutured to skin and attached to a suction bulb. Fluid was sent for culture. FINDINGS: Abscess in left upper pelvis adjacent to the sigmoid colon. Findings are compatible with a diverticular abscess. 20 mL of thick purulent fluid was removed from the abscess. 10 French drain was placed and the abscess was decompressed at the end of the procedure. IMPRESSION: CT-guided placement of a drain within the diverticular abscess in the left upper pelvis. Electronically Signed   By: Markus Daft M.D.   On: 04/01/2019 15:20       Subjective: Seen and examined at bedside, walking around the room, no complaints.  Tolerating diet.  Abdominal pain resolved.  Ready for discharge home.  Per surgery, okay for discharge with outpatient follow-up.  No other complaints or concerns at this time.  Denies headache, no fever/chills/night sweats, no nausea vomiting/diarrhea, no chest pain, no palpitations, no shortness of breath, no abdominal pain, no weakness, no paresthesias.  No acute events overnight per nursing staff.   Discharge Exam: Vitals:   04/02/19 0603 04/02/19 0857  BP: 113/78 114/64  Pulse: 65 72  Resp: 16 20  Temp: 97.8 F (36.6 C) 98.2 F (36.8 C)  SpO2: 94% 94%   Vitals:   04/01/19 2046 04/02/19 0006 04/02/19 0603 04/02/19 0857  BP: 124/81 114/72 113/78 114/64   Pulse: 67 64 65 72  Resp: 16 15 16 20   Temp: 98.8 F (37.1 C) 98.1 F (36.7 C) 97.8 F (36.6 C) 98.2 F (36.8 C)  TempSrc: Oral Oral Oral Oral  SpO2: 91% 91% 94% 94%  Weight:   86 kg   Height:        General: Pt is alert, awake, not in acute distress Cardiovascular: RRR, S1/S2 +, no rubs, no gallops Respiratory: CTA bilaterally, no wheezing, no rhonchi Abdominal: Soft, NT, ND, bowel sounds +, noted percutaneous drain with minimal output Extremities: no edema, no cyanosis    The results of significant diagnostics from this hospitalization (including imaging, microbiology, ancillary and laboratory) are listed below for reference.     Microbiology: Recent Results (from the past 240 hour(s))  SARS Coronavirus 2 [facility] order, Performed in [facility] hospital lab) Nasopharyngeal Nasopharyngeal Swab     Status: None   Collection Time: 03/26/19  6:57 PM   Specimen: Nasopharyngeal Swab  Result Value Ref Range Status   SARS Coronavirus 2 NEGATIVE NEGATIVE Final    Comment: (NOTE) If result is NEGATIVE SARS-CoV-2 target nucleic acids are NOT DETECTED. The SARS-CoV-2 RNA is generally detectable in upper and lower  respiratory specimens during the acute phase of infection. The lowest  concentration of SARS-CoV-2 viral copies this assay can detect is 250  copies / mL. A negative result does not preclude SARS-CoV-2 infection  and should not be used as the sole basis for treatment or other  patient management decisions.  A negative result may occur with  improper specimen collection / handling, submission of specimen other  than nasopharyngeal swab, presence of viral mutation(s) within the  areas targeted by this assay, and inadequate  number of viral copies  (<250 copies / mL). A negative result must be combined with clinical  observations, patient history, and epidemiological information. If result is POSITIVE SARS-CoV-2 target nucleic acids are DETECTED. The SARS-CoV-2 RNA is  generally detectable in upper and lower  respiratory specimens dur ing the acute phase of infection.  Positive  results are indicative of active infection with SARS-CoV-2.  Clinical  correlation with patient history and other diagnostic information is  necessary to determine patient infection status.  Positive results do  not rule out bacterial infection or co-infection with other viruses. If result is PRESUMPTIVE POSTIVE SARS-CoV-2 nucleic acids MAY BE PRESENT.   A presumptive positive result was obtained on the submitted specimen  and confirmed on repeat testing.  While 2019 novel coronavirus  (SARS-CoV-2) nucleic acids may be present in the submitted sample  additional confirmatory testing may be necessary for epidemiological  and / or clinical management purposes  to differentiate between  SARS-CoV-2 and other Sarbecovirus currently known to infect humans.  If clinically indicated additional testing with an alternate test  methodology [phone]) is advised. The SARS-CoV-2 RNA is generally  detectable in upper and lower respiratory sp ecimens during the acute  phase of infection. The expected result is Negative. Fact Sheet for Patients:  StrictlyIdeas.no Fact Sheet for Healthcare Providers: BankingDealers.co.za This test is not yet approved or cleared by the [place] FDA and has been authorized for detection and/or diagnosis of SARS-CoV-2 by FDA under an Emergency Use Authorization (EUA).  This EUA will remain in effect (meaning this test can be used) for the duration of the COVID-19 declaration under Section 564(b)(1) of the Act, 21 U.S.C. section 360bbb-3(b)(1), unless the authorization is terminated or revoked sooner. Performed at [facility], [place] [address], [place], [place] [address]   Blood Culture (routine x 2)     Status: None   Collection Time: 03/26/19  6:58 PM   Specimen: BLOOD  Result Value Ref Range Status    Specimen Description BLOOD LEFT ANTECUBITAL  Final   Special Requests   Final    BOTTLES DRAWN AEROBIC AND ANAEROBIC Blood Culture adequate volume   Culture   Final    NO GROWTH 5 DAYS Performed at [facility], [place] [address], [place], [place] [address]    Report Status 03/31/2019 FINAL  Final  Blood Culture (routine x 2)     Status: None   Collection Time: 03/26/19  7:46 PM   Specimen: BLOOD RIGHT FOREARM  Result Value Ref Range Status   Specimen Description BLOOD RIGHT FOREARM  Final   Special Requests   Final    BOTTLES DRAWN AEROBIC AND ANAEROBIC Blood Culture results may not be optimal due to an inadequate volume of blood received in culture bottles   Culture   Final    NO GROWTH 5 DAYS Performed at [facility], [place] [address] [place], [place] [address]    Report Status 03/31/2019 FINAL  Final  Urine culture     Status: None   Collection Time: 03/26/19  9:37 PM   Specimen: In/Out Cath Urine  Result Value Ref Range Status   Specimen Description IN/OUT CATH URINE  Final   Special Requests NONE  Final   Culture   Final    NO GROWTH Performed at [facility], [place] [address] [place],  [address]    Report Status 03/27/2019 FINAL  Final  Aerobic/Anaerobic Culture (surgical/deep wound)  Status: None (Preliminary result)   Collection Time: 04/01/19 11:29 AM   Specimen: Abscess  Result Value Ref Range Status   Specimen Description ABSCESS DIVERTICULAR  Final   Special Requests NONE  Final   Gram Stain   Final    ABUNDANT WBC PRESENT, PREDOMINANTLY PMN ABUNDANT GRAM POSITIVE COCCI FEW GRAM NEGATIVE RODS Performed at [facility], [address] [place], [place] [address]    Culture PENDING  Incomplete   Report Status PENDING  Incomplete     Labs: BNP (last 3 results) No results for input(s): BNP in the last 8760 hours. Basic Metabolic Panel: Recent Labs  Lab 03/28/19 0357 03/29/19 0427 03/30/19 0524 03/31/19 0519  04/02/19 0706  NA 134* 134* 134* 136 134*  K 3.3* 3.6 4.1 4.2 4.3  CL 103 103 101 101 98  CO2 19* 21* 21* 26 25  GLUCOSE 64* 100* 108* 91 106*  BUN 11 10 5* 6 14  CREATININE 0.78 0.69 0.75 0.78 0.86  CALCIUM 8.5* 8.1* 8.3* 8.5* 8.7*  MG 1.9  --  1.9  --   --    Liver Function Tests: Recent Labs  Lab 03/26/19 1829 03/27/19 0600  AST 12* 13*  ALT 13 12  ALKPHOS 57 54  BILITOT 1.0 1.2  PROT 6.6 5.9*  ALBUMIN 3.3* 2.7*   Recent Labs  Lab 03/26/19 1829  LIPASE 21   No results for input(s): AMMONIA in the last 168 hours. CBC: Recent Labs  Lab 03/27/19 0600 03/28/19 0357 03/29/19 0427 03/30/19 0524 03/31/19 0519 04/01/19 0545 04/02/19 0706  WBC 19.5* 13.9* 12.2* 9.5 11.1* 12.3* 11.3*  NEUTROABS 15.8* 11.0* 9.4*  --   --   --   --   HGB 13.1 14.2 13.1 13.5 13.3 13.8 14.0  HCT 39.0 43.2 39.4 41.2 39.9 41.0 43.1  MCV 91.8 93.7 93.4 93.0 92.4 91.5 92.5  PLT 265 263 336 342 377 394 403*   Cardiac Enzymes: No results for input(s): CKTOTAL, CKMB, CKMBINDEX, TROPONINI in the last 168 hours. BNP: Invalid input(s): POCBNP CBG: No results for input(s): GLUCAP in the last 168 hours. D-Dimer No results for input(s): DDIMER in the last 72 hours. Hgb A1c No results for input(s): HGBA1C in the last 72 hours. Lipid Profile No results for input(s): CHOL, HDL, LDLCALC, TRIG, CHOLHDL, LDLDIRECT in the last 72 hours. Thyroid function studies No results for input(s): TSH, T4TOTAL, T3FREE, THYROIDAB in the last 72 hours.  Invalid input(s): FREET3 Anemia work up No results for input(s): VITAMINB12, FOLATE, FERRITIN, TIBC, IRON, RETICCTPCT in the last 72 hours. Urinalysis    Component Value Date/Time   COLORURINE YELLOW 03/26/2019 1857   APPEARANCEUR CLEAR 03/26/2019 1857   LABSPEC >1.046 (H) 03/26/2019 1857   PHURINE 6.0 03/26/2019 1857   GLUCOSEU NEGATIVE 03/26/2019 1857   HGBUR SMALL (A) 03/26/2019 1857   BILIRUBINUR NEGATIVE 03/26/2019 1857   KETONESUR NEGATIVE  03/26/2019 1857   PROTEINUR NEGATIVE 03/26/2019 1857   NITRITE NEGATIVE 03/26/2019 1857   LEUKOCYTESUR NEGATIVE 03/26/2019 1857   Sepsis Labs Invalid input(s): PROCALCITONIN,  WBC,  LACTICIDVEN Microbiology Recent Results (from the past 240 hour(s))  SARS Coronavirus 2 [facility] order, Performed in [facility] hospital lab) Nasopharyngeal Nasopharyngeal Swab     Status: None   Collection Time: 03/26/19  6:57 PM   Specimen: Nasopharyngeal Swab  Result Value Ref Range Status   SARS Coronavirus 2 NEGATIVE NEGATIVE Final    Comment: (NOTE) If result is NEGATIVE SARS-CoV-2 target nucleic acids are  NOT DETECTED. The SARS-CoV-2 RNA is generally detectable in upper and lower  respiratory specimens during the acute phase of infection. The lowest  concentration of SARS-CoV-2 viral copies this assay can detect is 250  copies / mL. A negative result does not preclude SARS-CoV-2 infection  and should not be used as the sole basis for treatment or other  patient management decisions.  A negative result may occur with  improper specimen collection / handling, submission of specimen other  than nasopharyngeal swab, presence of viral mutation(s) within the  areas targeted by this assay, and inadequate number of viral copies  (<250 copies / mL). A negative result must be combined with clinical  observations, patient history, and epidemiological information. If result is POSITIVE SARS-CoV-2 target nucleic acids are DETECTED. The SARS-CoV-2 RNA is generally detectable in upper and lower  respiratory specimens dur ing the acute phase of infection.  Positive  results are indicative of active infection with SARS-CoV-2.  Clinical  correlation with patient history and other diagnostic information is  necessary to determine patient infection status.  Positive results do  not rule out bacterial infection or co-infection with other viruses. If result is PRESUMPTIVE POSTIVE SARS-CoV-2 nucleic acids MAY BE  PRESENT.   A presumptive positive result was obtained on the submitted specimen  and confirmed on repeat testing.  While 2019 novel coronavirus  (SARS-CoV-2) nucleic acids may be present in the submitted sample  additional confirmatory testing may be necessary for epidemiological  and / or clinical management purposes  to differentiate between  SARS-CoV-2 and other Sarbecovirus currently known to infect humans.  If clinically indicated additional testing with an alternate test  methodology [phone]) is advised. The SARS-CoV-2 RNA is generally  detectable in upper and lower respiratory sp ecimens during the acute  phase of infection. The expected result is Negative. Fact Sheet for Patients:  StrictlyIdeas.no Fact Sheet for Healthcare Providers: BankingDealers.co.za This test is not yet approved or cleared by the [place] FDA and has been authorized for detection and/or diagnosis of SARS-CoV-2 by FDA under an Emergency Use Authorization (EUA).  This EUA will remain in effect (meaning this test can be used) for the duration of the COVID-19 declaration under Section 564(b)(1) of the Act, 21 U.S.C. section 360bbb-3(b)(1), unless the authorization is terminated or revoked sooner. Performed at [facility], [place] [address], [place], [place] [address]   Blood Culture (routine x 2)     Status: None   Collection Time: 03/26/19  6:58 PM   Specimen: BLOOD  Result Value Ref Range Status   Specimen Description BLOOD LEFT ANTECUBITAL  Final   Special Requests   Final    BOTTLES DRAWN AEROBIC AND ANAEROBIC Blood Culture adequate volume   Culture   Final    NO GROWTH 5 DAYS Performed at [facility], [place] [address], [place], [place] [address]    Report Status 03/31/2019 FINAL  Final  Blood Culture (routine x 2)     Status: None   Collection Time: 03/26/19  7:46 PM   Specimen: BLOOD RIGHT FOREARM  Result Value Ref Range Status    Specimen Description BLOOD RIGHT FOREARM  Final   Special Requests   Final    BOTTLES DRAWN AEROBIC AND ANAEROBIC Blood Culture results may not be optimal due to an inadequate volume of blood received in culture bottles   Culture   Final    NO GROWTH 5 DAYS Performed at [facility], [place]  [address], [place], [place] [address]    Report Status 03/31/2019 FINAL  Final  Urine culture     Status: None   Collection Time: 03/26/19  9:37 PM   Specimen: In/Out Cath Urine  Result Value Ref Range Status   Specimen Description IN/OUT CATH URINE  Final   Special Requests NONE  Final   Culture   Final    NO GROWTH Performed at [facility], [place] [address], [place],  [address]    Report Status 03/27/2019 FINAL  Final  Aerobic/Anaerobic Culture (surgical/deep wound)     Status: None (Preliminary result)   Collection Time: 04/01/19 11:29 AM   Specimen: Abscess  Result Value Ref Range Status   Specimen Description ABSCESS DIVERTICULAR  Final   Special Requests NONE  Final   Gram Stain   Final    ABUNDANT WBC PRESENT, PREDOMINANTLY PMN ABUNDANT GRAM POSITIVE COCCI FEW GRAM NEGATIVE RODS Performed at [facility], [address] [place],  [address]    Culture PENDING  Incomplete   Report Status PENDING  Incomplete     Time coordinating discharge: Over 30 minutes  SIGNED:   Val Schiavo J [place] DO  [facility] 04/02/2019, 10:17 AM

## 2019-04-02 NOTE — Progress Notes (Signed)
Patient's wife called back post discharge- MD did not give an order for pain medications.   Called MD  MD unable to call in -  Must have a written script.    Called wife- let her know she can come by and pick up as well as some more flushes and a measuring cup to measure JP drain output.    [facility] reported [facility] not [facility] time.   Gave 5 more flushes to last until RN can come or [facility] can get some.

## 2019-04-02 NOTE — Plan of Care (Signed)

## 2019-04-02 NOTE — Progress Notes (Signed)
Subjective/Chief Complaint: Pt doing well with drain in place  Tol PO well +BM   Objective: Vital signs in last 24 hours: Temp:  [97.8 F (36.6 C)-98.8 F (37.1 C)] 98.2 F (36.8 C) (09/06 0857) Pulse Rate:  [56-72] 72 (09/06 0857) Resp:  [15-22] 20 (09/06 0857) BP: (104-133)/(20-83) 114/64 (09/06 0857) SpO2:  [91 %-96 %] 94 % (09/06 0857) Weight:  [86 kg] 86 kg (09/06 0603) Last BM Date: 04/01/19  Intake/Output from previous day: 09/05 0701 - 09/06 0700 In: 733 [P.O.:720] Out: 1460 [Urine:1400; Drains:60] Intake/Output this shift: Total I/O In: 480 [P.O.:480] Out: -   Constitutional: No acute distress, conversant, appears states age. Eyes: Anicteric sclerae, moist conjunctiva, no lid lag Lungs: Clear to auscultation bilaterally, normal respiratory effort CV: regular rate and rhythm, no murmurs, no peripheral edema, pedal pulses 2+ GI: Soft, no masses or hepatosplenomegaly, non-tender to palpation, drain in place with min output Skin: No rashes, palpation reveals normal turgor Psychiatric: appropriate judgment and insight, oriented to person, place, and time   Lab Results:  Recent Labs    04/01/19 0545 04/02/19 0706  WBC 12.3* 11.3*  HGB 13.8 14.0  HCT 41.0 43.1  PLT 394 403*   BMET Recent Labs    03/31/19 0519 04/02/19 0706  NA 136 134*  K 4.2 4.3  CL 101 98  CO2 26 25  GLUCOSE 91 106*  BUN 6 14  CREATININE 0.78 0.86  CALCIUM 8.5* 8.7*   PT/INR No results for input(s): LABPROT, INR in the last 72 hours. ABG No results for input(s): PHART, HCO3 in the last 72 hours.  Invalid input(s): PCO2, PO2  Studies/Results: Ct Abdomen Pelvis W Contrast  Result Date: 03/31/2019 CLINICAL DATA:  Follow-up diverticulitis EXAM: CT ABDOMEN AND PELVIS WITH CONTRAST TECHNIQUE: Multidetector CT imaging of the abdomen and pelvis was performed using the standard protocol following bolus administration of intravenous contrast. CONTRAST:  137mL OMNIPAQUE IOHEXOL 300  MG/ML SOLN, additional oral enteric contrast COMPARISON:  03/26/2019 FINDINGS: Lower chest: Small bilateral pleural effusions and associated atelectasis or consolidation. Hepatobiliary: No solid liver abnormality is seen. Hepatic steatosis. No gallstones, gallbladder wall thickening, or biliary dilatation. Pancreas: Unremarkable. No pancreatic ductal dilatation or surrounding inflammatory changes. Spleen: Normal in size without significant abnormality. Adrenals/Urinary Tract: Adrenal glands are unremarkable. Kidneys are normal, without renal calculi, solid lesion, or hydronephrosis. Bladder is unremarkable. Stomach/Bowel: Stomach is within normal limits. Descending and sigmoid diverticulosis with extensive thickening of the mid to distal sigmoid and a large abscess overlying left inguinal vessels measuring 5.7 x 2.7 cm (series 3, image 70). This is substantially enlarged compared to prior examination at which time it measured 4.1 x 2.1 cm when measured similarly. There is an additional intramural component of abscess along the left aspect of the distal sigmoid measuring 1.5 cm (series 3, image 71), which is similar to prior examination. Vascular/Lymphatic: No significant vascular findings are present. No enlarged abdominal or pelvic lymph nodes. Reproductive: No mass or other significant abnormality. Other: Small fat containing bilateral inguinal hernias. No abdominopelvic ascites. Musculoskeletal: No acute or significant osseous findings. IMPRESSION: 1. Descending and sigmoid diverticulosis with extensive thickening of the mid to distal sigmoid and a large abscess overlying left inguinal vessels measuring 5.7 x 2.7 cm (series 3, image 70). This is substantially enlarged compared to prior examination at which time it measured 4.1 x 2.1 cm when measured similarly. There is an additional intramural component of abscess along the left aspect of the distal sigmoid measuring 1.5  cm (series 3, image 71), which is similar  to prior examination. Overall findings remain consistent with acute diverticulitis complicated by abscess. No evidence of overt perforation. 2.  Other chronic and incidental findings as detailed above. Electronically Signed   By: Eddie Candle M.D.   On: 03/31/2019 13:51   Ct Image Guided Drainage By Percutaneous Catheter  Result Date: 04/01/2019 INDICATION: 53 year old with a diverticular abscess in the left pelvis. EXAM: CT GUIDED DRAINAGE OF LEFT PELVIC ABSCESS MEDICATIONS: The patient is currently admitted to the hospital and receiving intravenous antibiotics. ANESTHESIA/SEDATION: 2.5 mg IV Versed 125 mcg IV Fentanyl Moderate Sedation Time:  20 minutes The patient was continuously monitored during the procedure by the interventional radiology nurse under my direct supervision. COMPLICATIONS: None immediate. TECHNIQUE: Informed written consent was obtained from the patient after a thorough discussion of the procedural risks, benefits and alternatives. All questions were addressed. Maximal Sterile Barrier Technique was utilized including caps, mask, sterile gowns, sterile gloves, sterile drape, hand hygiene and skin antiseptic. A timeout was performed prior to the initiation of the procedure. PROCEDURE: CT images were obtained through the lower abdomen and pelvis. The abscess in the left upper pelvic region was targeted for drainage. Left side of the abdomen and pelvis was prepped with chlorhexidine and sterile field was created. Skin and soft tissues were anesthetized with 1% lidocaine. Using CT guidance, 18 gauge trocar needle was directed into the abscess. Thick purulent fluid was aspirated. Stiff Amplatz wire was advanced into the collection and the tract was dilated to accommodate a 10.2 [place] multipurpose drain. 20 mL of thick purulent fluid was removed. Catheter was sutured to skin and attached to a suction bulb. Fluid was sent for culture. FINDINGS: Abscess in left upper pelvis adjacent to the sigmoid  colon. Findings are compatible with a diverticular abscess. 20 mL of thick purulent fluid was removed from the abscess. 10 French drain was placed and the abscess was decompressed at the end of the procedure. IMPRESSION: CT-guided placement of a drain within the diverticular abscess in the left upper pelvis. Electronically Signed   By: Markus Daft M.D.   On: 04/01/2019 15:20    Anti-infectives: Anti-infectives (From admission, onward)   Start     Dose/Rate Route Frequency Ordered Stop   03/30/19 1000  amoxicillin-clavulanate (AUGMENTIN) 875-125 MG per tablet 1 tablet     1 tablet Oral Every 12 hours 03/30/19 0857     03/27/19 0830  vancomycin (VANCOCIN) 1,250 mg in sodium chloride 0.9 % 250 mL IVPB  Status:  Discontinued     1,250 mg 166.7 mL/hr over 90 Minutes Intravenous Every 12 hours 03/26/19 1922 03/27/19 0739   03/27/19 0800  piperacillin-tazobactam (ZOSYN) IVPB 3.375 g  Status:  Discontinued     3.375 g 12.5 mL/hr over 240 Minutes Intravenous Every 8 hours 03/27/19 0750 03/30/19 0857   03/27/19 0600  metroNIDAZOLE (FLAGYL) IVPB 500 mg  Status:  Discontinued     500 mg 100 mL/hr over 60 Minutes Intravenous Every 8 hours 03/26/19 2313 03/27/19 0739   03/27/19 0330  ceFEPIme (MAXIPIME) 2 g in sodium chloride 0.9 % 100 mL IVPB  Status:  Discontinued     2 g 200 mL/hr over 30 Minutes Intravenous Every 8 hours 03/26/19 1922 03/27/19 0739   03/26/19 1930  vancomycin (VANCOCIN) 2,000 mg in sodium chloride 0.9 % 500 mL IVPB     2,000 mg 250 mL/hr over 120 Minutes Intravenous  Once 03/26/19 1919 03/26/19 2151   03/26/19  1915  ceFEPIme (MAXIPIME) 2 g in sodium chloride 0.9 % 100 mL IVPB     2 g 200 mL/hr over 30 Minutes Intravenous  Once 03/26/19 1910 03/26/19 2026   03/26/19 1915  metroNIDAZOLE (FLAGYL) IVPB 500 mg     500 mg 100 mL/hr over 60 Minutes Intravenous  Once 03/26/19 1910 03/26/19 2101   03/26/19 1915  vancomycin (VANCOCIN) IVPB 1000 mg/200 mL premix  Status:  Discontinued      1,000 mg 200 mL/hr over 60 Minutes Intravenous  Once 03/26/19 1910 03/26/19 1919      Assessment/Plan: HTN Depression Tobacco use Mild protein calorie malnutrition Hypokalemia-resolved  Sigmoid diverticulitis with microperforation and adjacent fluid collection - Drain in place FEN:tol Soft  VTE: SCDs, lovenox ID: Zosyn 8/30>9/3; PO augmentin 9/3>>  Boyer from surgery standpoint for DC.   F/u in chart   LOS: 7 days    Victor Boyer 04/02/2019

## 2019-04-02 NOTE — TOC Transition Note (Signed)
Transition of Care [facility]) - CM/SW Discharge Note   Patient Details  Name: Victor Boyer MRN: [id number] Date of Birth: Dec 06, 1965  [facility] CM/SW Contact:  Victor Collet, RN Phone Number: 04/02/2019, 11:30 AM   Clinical Narrative:   Damaris Schooner w patient at bedside. He will have wife to assist with dressing and JP. Would like to use Alvis Lemmings for [facility] RN services, discussed Medicare ratings, referral accepted by [facility]. No other CM needs.     Final next level of care: [place] Barriers to Discharge: No Barriers Identified   Patient Goals and CMS Choice Patient states their goals for this hospitalization and ongoing recovery are:: to go home CMS Medicare.gov Compare Post Acute Care list provided to:: Patient Choice offered to / list presented to : Patient  Discharge Placement                       Discharge Plan and Services                          HH Arranged: RN [facility] Agency: [place] Date [facility] Agency Contacted: 04/02/19 Time [place]: 1130 Representative spoke with at [place]: [place] ([place]) Interventions     Readmission Risk Interventions No flowsheet data found.

## 2019-04-02 NOTE — Progress Notes (Signed)
Patient discharge home with wife.  Discussed bandage changes and flushing (demostration included), S&S of infection and medications and follow up appointments.   Sent home instruction with patient.

## 2019-04-05 ENCOUNTER — Other Ambulatory Visit: Payer: Self-pay | Admitting: Surgery

## 2019-04-05 DIAGNOSIS — K5720 Diverticulitis of large intestine with perforation and abscess without bleeding: Secondary | ICD-10-CM

## 2019-04-06 LAB — AEROBIC/ANAEROBIC CULTURE W GRAM STAIN (SURGICAL/DEEP WOUND)

## 2019-04-11 ENCOUNTER — Encounter: Payer: Self-pay | Admitting: Radiology

## 2019-04-11 ENCOUNTER — Ambulatory Visit
Admission: RE | Admit: 2019-04-11 | Discharge: 2019-04-11 | Disposition: A | Discharge status: Home / Self Care | Payer: Commercial Managed Care - PPO | Source: Ambulatory Visit | Attending: Radiology | Admitting: Radiology

## 2019-04-11 ENCOUNTER — Ambulatory Visit
Admission: RE | Admit: 2019-04-11 | Discharge: 2019-04-11 | Disposition: A | Discharge status: Home / Self Care | Payer: Commercial Managed Care - PPO | Source: Ambulatory Visit | Attending: Surgery | Admitting: Surgery

## 2019-04-11 ENCOUNTER — Other Ambulatory Visit: Payer: Self-pay | Admitting: Surgery

## 2019-04-11 DIAGNOSIS — K5720 Diverticulitis of large intestine with perforation and abscess without bleeding: Secondary | ICD-10-CM

## 2019-04-11 HISTORY — PX: IR RADIOLOGIST EVAL & MGMT: IMG5224

## 2019-04-11 MED ORDER — IOPAMIDOL (ISOVUE-300) INJECTION 61%
100.0000 mL | Freq: Once | INTRAVENOUS | Status: AC | PRN
  Administered 2019-04-11: 100 mL via INTRAVENOUS

## 2019-04-11 NOTE — Progress Notes (Signed)
Referring Physician(s): DR C White  Chief Complaint: The patient is seen in follow up today s/p 04/01/19: CT-guided placement of a drain within the diverticular abscess in the left upper pelvis.  History of present illness:  Diverticular abscess Drain placed in IR 04/01/19  Scheduled today for follow up evaluation  CT today and drain injection  Pt denies abd pain Fever/chills Eating well; BM wnl OP serous color Scant now for days Flushing 2x/day Finished antibiotic yesterday  To see Dr Deland Pretty in ~1 week  Past Medical History:  Diagnosis Date  . Anxiety   . Depression   . High cholesterol   . Hypertension     Past Surgical History:  Procedure Laterality Date  . APPENDECTOMY    . IR RADIOLOGIST EVAL & MGMT  04/11/2019    Allergies: Patient has no known allergies.  Medications: Prior to Admission medications   Medication Sig Start Date End Date Taking? Authorizing Provider  acetaminophen (TYLENOL) 500 MG tablet Take 500 mg by mouth every 6 (six) hours as needed.    [provider]  busPIRone (BUSPAR) 5 MG tablet Take 5 mg by mouth 2 (two) times daily.    [provider]  citalopram (CELEXA) 20 MG tablet Take 1 tablet (20 mg total) by mouth daily. 01/27/19   Connye Burkitt, NP  [facility] Adhesive Tape 1.5"x10yd TAPE Use to secure dressing daily 04/02/19   [place] Donnamarie Poag, DO  fenofibrate 160 MG tablet Take 160 mg by mouth daily. with food 10/31/18   [provider]  Gauze Pads & Dressings (GAUZE DRESSING) 4"X4" PADS Spit gauze and place at drain site daily. 04/02/19   [place] Donnamarie Poag, DO  lisinopril-hydrochlorothiazide (ZESTORETIC) 20-25 MG tablet Take 1 tablet by mouth daily. 12/23/18   [provider]  Multiple Vitamin (MULTIVITAMIN WITH MINERALS) TABS tablet Take 1 tablet by mouth daily.    [provider]  sodium chloride flush 0.9 % SOLN injection Place 10 mLs into feeding tube every 8 (eight) hours. 04/02/19 05/02/19  [place] Donnamarie Poag,  DO  traZODone (DESYREL) 50 MG tablet Take 1 tablet (50 mg total) by mouth at bedtime as needed for sleep. 01/26/19   Connye Burkitt, NP     Family History  Problem Relation Age of Onset  . Prostate cancer Maternal Grandfather     Social History   Socioeconomic History  . Marital status: Married    Spouse name: Not on file  . Number of children: Not on file  . Years of education: Not on file  . Highest education level: Not on file  Occupational History  . Not on file  Social Needs  . Financial resource strain: Not on file  . Food insecurity    Worry: Not on file    Inability: Not on file  . Transportation needs    Medical: Not on file    Non-medical: Not on file  Tobacco Use  . Smoking status: Current Every Day Smoker    Packs/day: 1.00    Types: Cigarettes  . Smokeless tobacco: Never Used  Substance and Sexual Activity  . Alcohol use: Yes    Comment: 24 pack/week  . Drug use: Never  . Sexual activity: Yes    Birth control/protection: Coitus interruptus  Lifestyle  . Physical activity    Days per week: Not on file    Minutes per session: Not on file  . Stress: Not on file  Relationships  . Social connections  Talks on phone: Not on file    Gets together: Not on file    Attends religious service: Not on file    Active member of club or organization: Not on file    Attends meetings of clubs or organizations: Not on file    Relationship status: Not on file  Other Topics Concern  . Not on file  Social History Narrative  . Not on file     Vital Signs: BP (!) 149/84   Pulse 76   Temp 98.3 F (36.8 C)   SpO2 97%   Physical Exam Vitals signs reviewed.  Skin:    General: Skin is warm and dry.     Comments: Site is clean and dry NT no bleeding OP is scant in JP Serous/cloudy  Drain injection shows NO connection to bowel  Neurological:     Mental Status: He is alert.     Imaging: Ir Radiologist Eval & Mgmt  Result Date: 04/11/2019 Please refer to  notes tab for details about interventional procedure. (Op Note)   Labs:  CBC: Recent Labs    03/30/19 0524 03/31/19 0519 04/01/19 0545 04/02/19 0706  WBC 9.5 11.1* 12.3* 11.3*  HGB 13.5 13.3 13.8 14.0  HCT 41.2 39.9 41.0 43.1  PLT 342 377 394 403*    COAGS: No results for input(s): INR, APTT in the last 8760 hours.  BMP: Recent Labs    03/29/19 0427 03/30/19 0524 03/31/19 0519 04/02/19 0706  NA 134* 134* 136 134*  K 3.6 4.1 4.2 4.3  CL 103 101 101 98  CO2 21* 21* 26 25  GLUCOSE 100* 108* 91 106*  BUN 10 5* 6 14  CALCIUM 8.1* 8.3* 8.5* 8.7*  CREATININE 0.69 0.75 0.78 0.86  GFRNONAA >60 >60 >60 >60  GFRAA >60 >60 >60 >60    LIVER FUNCTION TESTS: Recent Labs    01/21/19 0616 03/26/19 1829 03/27/19 0600  BILITOT 0.4 1.0 1.2  AST 14* 12* 13*  ALT 16 13 12   ALKPHOS 60 57 54  PROT 7.5 6.6 5.9*  ALBUMIN 4.1 3.3* 2.7*    Assessment:  Diverticular abscess CT reveals resolved collection per Dr Stephenie Acres injection reveals NO connection to bowel per Dr Earleen Newport Continue drain for now Stop flushing and now to gravity bag Keep appt with Dr Dema Severin in 1 week For appt with IR 4 weeks -- unless drain removed before then He has good understanding of plan   Signed: Lavonia Drafts, PA-C 04/11/2019, 1:12 PM   Please refer to Dr. Dema Severin attestation of this note for management and plan.

## 2019-04-12 ENCOUNTER — Other Ambulatory Visit: Payer: Commercial Managed Care - PPO

## 2019-04-26 ENCOUNTER — Encounter: Payer: Self-pay | Admitting: Gastroenterology

## 2019-04-27 ENCOUNTER — Telehealth: Payer: Self-pay | Admitting: *Deleted

## 2019-04-27 NOTE — Telephone Encounter (Signed)
He will most likely be able to have his colonoscopy with me as scheduled, but I would like to review the records from [facility] first.  Please place them in my office for review.  - HD

## 2019-04-27 NOTE — Telephone Encounter (Signed)
Dr Loletha Carrow,  This pt is scheduled with you for a direct colon 05-24-2019. We received notes from [facility]- pt was admitted 8-30 with Sepsis due to diverticulitis- he under went percutaneous drainage 03-31-2019- he had a drain study 04-11-2019 which demonstrated no clear communication with the colon- drain remains in place and pt has switched over to gravity-  He has 3-5 cc of fluid drainage and the drainage is thin but brown in color.  He was referred for a colon prior to any surgical intervention.  Note states pt will follow up with CS after colonoscopy He has no GI hx.   Do you want him to have an OV with you prior to his colonoscopy or is the direct colon ok?  Please advise, Thanks Lelan Pons

## 2019-04-27 NOTE — Telephone Encounter (Signed)
[facility] records placed in your in box on your desk. Victor Boyer

## 2019-04-28 NOTE — Telephone Encounter (Signed)
I reviewed the 9/29 office note by Dr. Dema Severin, and then messaged back and forth with him about this.  Proceed with colonoscopy as planned in [place].  Patient will have drain in place until a surgical resection that will be planned after the colonoscopy is done.  No special plans or instructions for prep or procedure.  I will return the records to you so they can be with his chart for upcoming visits.

## 2019-04-28 NOTE — Telephone Encounter (Signed)
Thank you will proceed as scheduled  

## 2019-05-09 ENCOUNTER — Ambulatory Visit
Admission: RE | Admit: 2019-05-09 | Discharge: 2019-05-09 | Disposition: A | Discharge status: Home / Self Care | Payer: Commercial Managed Care - PPO | Source: Ambulatory Visit | Attending: Surgery | Admitting: Surgery

## 2019-05-09 ENCOUNTER — Encounter: Payer: Self-pay | Admitting: Radiology

## 2019-05-09 DIAGNOSIS — K5720 Diverticulitis of large intestine with perforation and abscess without bleeding: Secondary | ICD-10-CM

## 2019-05-09 HISTORY — PX: IR RADIOLOGIST EVAL & MGMT: IMG5224

## 2019-05-09 NOTE — Progress Notes (Signed)
Referring Physician(s): White,Christopher M  Chief Complaint: The patient is seen in follow up today s/p drainage of a left pelvic/diverticular abscess on 04/01/19  History of present illness: Victor Boyer is a 53 year old white male with history of left diverticular abscess, status post drain placement on 04/01/2019.  Follow-up CT and drain injection on 04/11/2019 revealed resolution of the drained abscess and no fistula to bowel.  He presents again today for drain evaluation and possible repeat injection.  He currently denies fever,  chest pain, dyspnea, cough, worsening abdominal/back pain, or bleeding. He does have occ HA's and intermittent n/v.  He is not on antibiotic therapy.  Previous drain fluid cultures grew E. coli and Bacteroides.  He is not flushing the drain at this time.  He continues to have output of turbid brown-colored/feculent appearing fluid in drain bag but output has not increased since last visit.  He is scheduled for colonoscopy on 10/28 with subsequent surgical follow-up afterwards.   Past Medical History:  Diagnosis Date  . Anxiety   . Depression   . High cholesterol   . Hypertension     Past Surgical History:  Procedure Laterality Date  . APPENDECTOMY    . IR RADIOLOGIST EVAL & MGMT  04/11/2019    Allergies: Patient has no known allergies.  Medications: Prior to Admission medications   Medication Sig Start Date End Date Taking? Authorizing Provider  acetaminophen (TYLENOL) 500 MG tablet Take 500 mg by mouth every 6 (six) hours as needed.    [provider]  busPIRone (BUSPAR) 5 MG tablet Take 5 mg by mouth 2 (two) times daily.    [provider]  citalopram (CELEXA) 20 MG tablet Take 1 tablet (20 mg total) by mouth daily. 01/27/19   Connye Burkitt, NP  [facility] Adhesive Tape 1.5"x10yd TAPE Use to secure dressing daily 04/02/19   [place] Donnamarie Poag, DO  fenofibrate 160 MG tablet Take 160 mg by mouth daily. with food 10/31/18   [provider]   Gauze Pads & Dressings (GAUZE DRESSING) 4"X4" PADS Spit gauze and place at drain site daily. 04/02/19   [place] Donnamarie Poag, DO  lisinopril-hydrochlorothiazide (ZESTORETIC) 20-25 MG tablet Take 1 tablet by mouth daily. 12/23/18   [provider]  Multiple Vitamin (MULTIVITAMIN WITH MINERALS) TABS tablet Take 1 tablet by mouth daily.    [provider]  traZODone (DESYREL) 50 MG tablet Take 1 tablet (50 mg total) by mouth at bedtime as needed for sleep. 01/26/19   Connye Burkitt, NP     Family History  Problem Relation Age of Onset  . Prostate cancer Maternal Grandfather     Social History   Socioeconomic History  . Marital status: Married    Spouse name: Not on file  . Number of children: Not on file  . Years of education: Not on file  . Highest education level: Not on file  Occupational History  . Not on file  Social Needs  . Financial resource strain: Not on file  . Food insecurity    Worry: Not on file    Inability: Not on file  . Transportation needs    Medical: Not on file    Non-medical: Not on file  Tobacco Use  . Smoking status: Current Every Day Smoker    Packs/day: 1.00    Types: Cigarettes  . Smokeless tobacco: Never Used  Substance and Sexual Activity  . Alcohol use: Yes    Comment: 24 pack/week  . Drug use:  Never  . Sexual activity: Yes    Birth control/protection: Coitus interruptus  Lifestyle  . Physical activity    Days per week: Not on file    Minutes per session: Not on file  . Stress: Not on file  Relationships  . Social Herbalist on phone: Not on file    Gets together: Not on file    Attends religious service: Not on file    Active member of club or organization: Not on file    Attends meetings of clubs or organizations: Not on file    Relationship status: Not on file  Other Topics Concern  . Not on file  Social History Narrative  . Not on file     Vital Signs: Blood pressure 160/93, heart rate 106, temp 98.4, O2  sats 97% room air   Physical Exam awake, alert.  Left lower quadrant abdominal drain intact, insertion site okay, site non tender.  Small amount of turbid, brown/feculent appearing fluid in drain bag  Imaging: No results found.  Labs:  CBC: Recent Labs    03/30/19 0524 03/31/19 0519 04/01/19 0545 04/02/19 0706  WBC 9.5 11.1* 12.3* 11.3*  HGB 13.5 13.3 13.8 14.0  HCT 41.2 39.9 41.0 43.1  PLT 342 377 394 403*    COAGS: No results for input(s): INR, APTT in the last 8760 hours.  BMP: Recent Labs    03/29/19 0427 03/30/19 0524 03/31/19 0519 04/02/19 0706  NA 134* 134* 136 134*  K 3.6 4.1 4.2 4.3  CL 103 101 101 98  CO2 21* 21* 26 25  GLUCOSE 100* 108* 91 106*  BUN 10 5* 6 14  CALCIUM 8.1* 8.3* 8.5* 8.7*  CREATININE 0.69 0.75 0.78 0.86  GFRNONAA >60 >60 >60 >60  GFRAA >60 >60 >60 >60    LIVER FUNCTION TESTS: Recent Labs    01/21/19 0616 03/26/19 1829 03/27/19 0600  BILITOT 0.4 1.0 1.2  AST 14* 12* 13*  ALT 16 13 12   ALKPHOS 60 57 54  PROT 7.5 6.6 5.9*  ALBUMIN 4.1 3.3* 2.7*    Assessment: 53 year old white male with history of left diverticular abscess, status post drain placement on 04/01/2019.  Follow-up CT and drain injection on 04/11/2019 revealed resolution of the drained abscess and no fistula to bowel.  He currently denies fever,  chest pain, dyspnea, cough, worsening abdominal/back pain, or bleeding. He does have occ HA's and intermittent n/v.  He is not on antibiotic therapy.  Previous drain fluid cultures grew E. coli and Bacteroides.  He is not flushing the drain at this time.  He continues to have output of turbid brown-colored/feculent appearing fluid in drain bag but output has not increased since last visit.  He is scheduled for colonoscopy on 10/28 with subsequent surgical follow-up afterwards.  Case discussed with Dr. Kathlene Cote. Recommend continuation of drain since pt continues to have some output (although small amounts) of feculent appearing  fluid in bag; instructed pt not to flush drain; drain injection not performed as management would not be altered and could potentially create fistula which was not present with earlier injection; await surgical f/u plans after colonoscopy. New statlock device placed onto catheter. Above d/w pt.   Signed: D. Rowe Robert, PA-C 05/09/2019, 2:19 PM   Please refer to Dr. Margaretmary Dys attestation of this note for management and plan.      Patient ID: Victor Boyer, male   DOB: 21-May-1966, 53 y.o.   MRN: [id number]

## 2019-05-16 ENCOUNTER — Ambulatory Visit: Payer: Self-pay | Admitting: *Deleted

## 2019-05-16 ENCOUNTER — Other Ambulatory Visit: Payer: Self-pay

## 2019-05-16 VITALS — Temp 97.5°F | Ht 71.0 in | Wt 198.0 lb

## 2019-05-16 DIAGNOSIS — K5792 Diverticulitis of intestine, part unspecified, without perforation or abscess without bleeding: Secondary | ICD-10-CM

## 2019-05-16 MED ORDER — SUPREP BOWEL PREP KIT 17.5-3.13-1.6 GM/177ML PO SOLN: 1.0000 | Freq: Once | ORAL | 0 refills | Status: AC

## 2019-05-16 NOTE — Progress Notes (Signed)
No egg or soy allergy known to patient  No issues with past sedation with any surgeries  or procedures, no intubation problems  No diet pills per patient No home 02 use per patient  No blood thinners per patient  Pt denies issues with constipation  No A fib or A flutter  EMMI information given to patient suprep coupon 15.00 given to the patient  Due to the COVID-19 pandemic we are asking patients to follow these guidelines. Please only bring one care partner. Please be aware that your care partner may wait in the car in the parking lot or if they feel like they will be too hot to wait in the car, they may wait in the lobby on the 4th floor. All care partners are required to wear a mask the entire time (we do not have any that we can provide them), they need to practice social distancing, and we will do a Covid check for all patient's and care partners when you arrive. Also we will check their temperature and your temperature. If the care partner waits in their car they need to stay in the parking lot the entire time and we will call them on their cell phone when the patient is ready for discharge so they can bring the car to the front of the building. Also all patient's will need to wear a mask into building.

## 2019-05-23 ENCOUNTER — Telehealth: Payer: Self-pay

## 2019-05-23 NOTE — Telephone Encounter (Signed)
Covid-19 screening questions   Do you now or have you had a fever in the last 14 days?  Do you have any respiratory symptoms of shortness of breath or cough now or in the last 14 days?  Do you have any family members or close contacts with diagnosed or suspected Covid-19 in the past 14 days?  Have you been tested for Covid-19 and found to be positive?       

## 2019-05-23 NOTE — Telephone Encounter (Signed)
Pt responded "no" to all questions.  °

## 2019-05-24 ENCOUNTER — Encounter: Payer: Commercial Managed Care - PPO | Admitting: Gastroenterology

## 2019-05-24 ENCOUNTER — Other Ambulatory Visit: Payer: Self-pay

## 2019-05-24 ENCOUNTER — Ambulatory Visit (AMBULATORY_SURGERY_CENTER): Payer: Self-pay | Admitting: Gastroenterology

## 2019-05-24 ENCOUNTER — Encounter: Payer: Self-pay | Admitting: Gastroenterology

## 2019-05-24 VITALS — BP 124/73 | HR 85 | Temp 98.9°F | Resp 31 | Ht 70.0 in | Wt 198.0 lb

## 2019-05-24 DIAGNOSIS — K5732 Diverticulitis of large intestine without perforation or abscess without bleeding: Secondary | ICD-10-CM

## 2019-05-24 DIAGNOSIS — D122 Benign neoplasm of ascending colon: Secondary | ICD-10-CM

## 2019-05-24 DIAGNOSIS — D123 Benign neoplasm of transverse colon: Secondary | ICD-10-CM

## 2019-05-24 DIAGNOSIS — Z1211 Encounter for screening for malignant neoplasm of colon: Secondary | ICD-10-CM

## 2019-05-24 DIAGNOSIS — K621 Rectal polyp: Secondary | ICD-10-CM

## 2019-05-24 MED ORDER — SODIUM CHLORIDE 0.9 % IV SOLN: 500.0000 mL | Freq: Once | INTRAVENOUS | Status: DC

## 2019-05-24 NOTE — Progress Notes (Signed)
Called to room to assist during endoscopic procedure.  Patient ID and intended procedure confirmed with present staff. Received instructions for my participation in the procedure from the performing physician.  

## 2019-05-24 NOTE — Progress Notes (Signed)
PT taken to PACU. Monitors in place. VSS. Report given to RN. 

## 2019-05-24 NOTE — Progress Notes (Signed)
Pt's states no medical or surgical changes since previsit or office visit.  Temp JB VS CW 

## 2019-05-24 NOTE — Patient Instructions (Signed)
Discharge instructions given. Handouts on polyps and diverticulosis. Resume previous medications. YOU HAD AN ENDOSCOPIC PROCEDURE TODAY AT THE [facility]:   Refer to the procedure report that was given to you for any specific questions about what was found during the examination.  If the procedure report does not answer your questions, please call your gastroenterologist to clarify.  If you requested that your care partner not be given the details of your procedure findings, then the procedure report has been included in a sealed envelope for you to review at your convenience later.  YOU SHOULD EXPECT: Some feelings of bloating in the abdomen. Passage of more gas than usual.  Walking can help get rid of the air that was put into your GI tract during the procedure and reduce the bloating. If you had a lower endoscopy (such as a colonoscopy or flexible sigmoidoscopy) you may notice spotting of blood in your stool or on the toilet paper. If you underwent a bowel prep for your procedure, you may not have a normal bowel movement for a few days.  Please Note:  You might notice some irritation and congestion in your nose or some drainage.  This is from the oxygen used during your procedure.  There is no need for concern and it should clear up in a day or so.  SYMPTOMS TO REPORT IMMEDIATELY:   Following lower endoscopy (colonoscopy or flexible sigmoidoscopy):  Excessive amounts of blood in the stool  Significant tenderness or worsening of abdominal pains  Swelling of the abdomen that is new, acute  Fever of 100F or higher   For urgent or emergent issues, a gastroenterologist can be reached at any hour by calling [phone].   DIET:  We do recommend a small meal at first, but then you may proceed to your regular diet.  Drink plenty of fluids but you should avoid alcoholic beverages for 24 hours.  ACTIVITY:  You should plan to take it easy for the rest of today and you should NOT  DRIVE or use heavy machinery until tomorrow (because of the sedation medicines used during the test).    FOLLOW UP: Our staff will call the number listed on your records 48-72 hours following your procedure to check on you and address any questions or concerns that you may have regarding the information given to you following your procedure. If we do not reach you, we will leave a message.  We will attempt to reach you two times.  During this call, we will ask if you have developed any symptoms of COVID 19. If you develop any symptoms (ie: fever, flu-like symptoms, shortness of breath, cough etc.) before then, please call [phone].  If you test positive for Covid 19 in the 2 weeks post procedure, please call and report this information to us.    If any biopsies were taken you will be contacted by phone or by letter within the next 1-3 weeks.  Please call us at [phone] if you have not heard about the biopsies in 3 weeks.    SIGNATURES/CONFIDENTIALITY: You and/or your care partner have signed paperwork which will be entered into your electronic medical record.  These signatures attest to the fact that that the information above on your After Visit Summary has been reviewed and is understood.  Full responsibility of the confidentiality of this discharge information lies with you and/or your care-partner. 

## 2019-05-24 NOTE — Op Note (Signed)
[facility] Patient Name: Victor Boyer Procedure Date: 05/24/2019 7:52 AM MRN: [id number] Endoscopist: Mallie Mussel L. Loletha Carrow , MD Age: 53 Referring MD:  Date of Birth: 05-22-1966 Gender: Male Account #: [id number] Procedure:                Colonoscopy Indications:              Screening for colorectal malignant neoplasm, This                            is the patient's first colonoscopy (patient had                            complicated diverticulitis with drain placement and                            elective sigmoid resection is planned) Medicines:                Monitored Anesthesia Care Procedure:                Pre-Anesthesia Assessment:                           - Prior to the procedure, a History and Physical                            was performed, and patient medications and                            allergies were reviewed. The patient's tolerance of                            previous anesthesia was also reviewed. The risks                            and benefits of the procedure and the sedation                            options and risks were discussed with the patient.                            All questions were answered, and informed consent                            was obtained. Prior Anticoagulants: The patient has                            taken no previous anticoagulant or antiplatelet                            agents. ASA Grade Assessment: II - A patient with                            mild systemic disease. After reviewing the risks  and benefits, the patient was deemed in                            satisfactory condition to undergo the procedure.                           After obtaining informed consent, the colonoscope                            was passed under direct vision. Throughout the                            procedure, the patient's blood pressure, pulse, and                            oxygen saturations were  monitored continuously. The                            Colonoscope was introduced through the anus and                            advanced to the the terminal ileum, with                            identification of the appendiceal orifice and IC                            valve. The colonoscopy was performed without                            difficulty. The patient tolerated the procedure                            well. The quality of the bowel preparation was                            good. The terminal ileum, ileocecal valve,                            appendiceal orifice, and rectum were photographed. Scope In: 8:08:14 AM Scope Out: 8:24:01 AM Scope Withdrawal Time: 0 hours 13 minutes 2 seconds  Total Procedure Duration: 0 hours 15 minutes 47 seconds  Findings:                 The perianal and digital rectal examinations were                            normal.                           The terminal ileum appeared normal.                           A 5 mm polyp was found in the ascending colon. The  polyp was sessile. The polyp was removed with a                            cold snare. Resection and retrieval were complete.                           A diminutive polyp was found in the distal                            transverse colon. The polyp was sessile. The polyp                            was removed with a cold snare. Resection and                            retrieval were complete.                           Multiple diverticula were found in the left colon.                           Multiple hyperplastic polyps were found in the                            rectum. The polyps were small in size.                           The exam was otherwise without abnormality on                            direct and retroflexion views. Complications:            No immediate complications. Estimated Blood Loss:     Estimated blood loss was minimal. Impression:                - The examined portion of the ileum was normal.                           - One 5 mm polyp in the ascending colon, removed                            with a cold snare. Resected and retrieved.                           - One diminutive polyp in the distal transverse                            colon, removed with a cold snare. Resected and                            retrieved.                           - Diverticulosis in the left colon.                           -  Multiple small polyps in the rectum.                           - The examination was otherwise normal on direct                            and retroflexion views. Recommendation:           - Patient has a contact number available for                            emergencies. The signs and symptoms of potential                            delayed complications were discussed with the                            patient. Return to normal activities tomorrow.                            Written discharge instructions were provided to the                            patient.                           - Resume previous diet.                           - Continue present medications.                           - Await pathology results.                           - Repeat colonoscopy is recommended for                            surveillance. The colonoscopy date will be                            determined after pathology results from today's                            exam become available for review. Victor Boyer L. Loletha Carrow, MD 05/24/2019 8:33:53 AM This report has been signed electronically.

## 2019-05-26 ENCOUNTER — Telehealth: Payer: Self-pay

## 2019-05-26 NOTE — Telephone Encounter (Signed)
  Follow up Call-  Call back number 05/24/2019  Post procedure Call Back phone  # [phone]  Permission to leave phone message Yes  Some recent data might be hidden     Patient questions:  Do you have a fever, pain , or abdominal swelling? No. Pain Score  0 *  Have you tolerated food without any problems? Yes.    Have you been able to return to your normal activities? Yes.    Do you have any questions about your discharge instructions: Diet   No. Medications  No. Follow up visit  No.  Do you have questions or concerns about your Care? No.  Actions: * If pain score is 4 or above: No action needed, pain <4. 1. Have you developed a fever since your procedure? no  2.   Have you had an respiratory symptoms (SOB or cough) since your procedure? no  3.   Have you tested positive for COVID 19 since your procedure no  4.   Have you had any family members/close contacts diagnosed with the COVID 19 since your procedure?  no   If yes to any of these questions please route to Joylene Kinser, RN and Alphonsa Gin, Therapist, sports.

## 2019-05-26 NOTE — Telephone Encounter (Signed)
Left message on follow up call. 

## 2019-05-31 ENCOUNTER — Encounter: Payer: Self-pay | Admitting: Gastroenterology

## 2020-04-19 IMAGING — RF DG SINUS / FISTULA TRACT / ABSCESSOGRAM
2 series · 8 of 8 positions shown · non-contrast
Comparison: none

INDICATION: 53-year-old male with a history diverticular abscess.

[Series 1: sequence · 4 of 35 frames shown]
[frame 2/35]
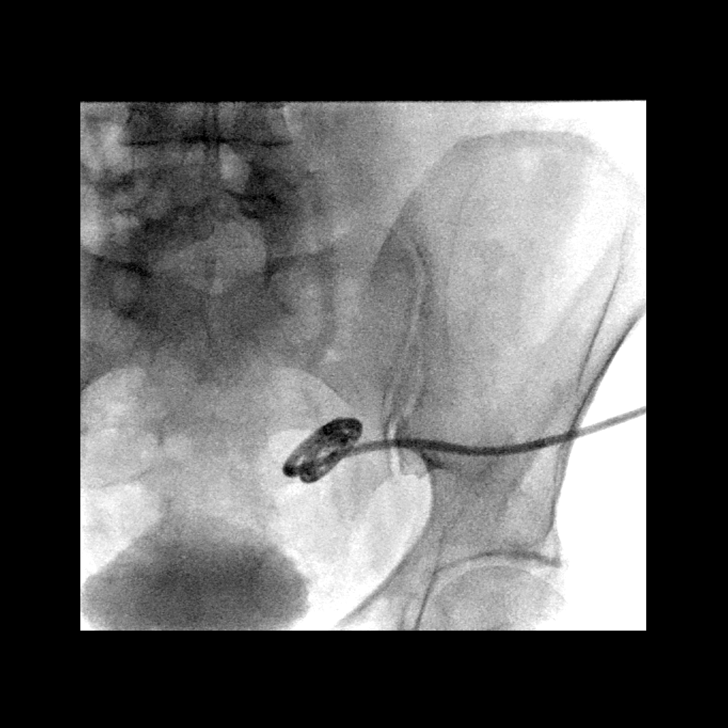
[frame 6/35]
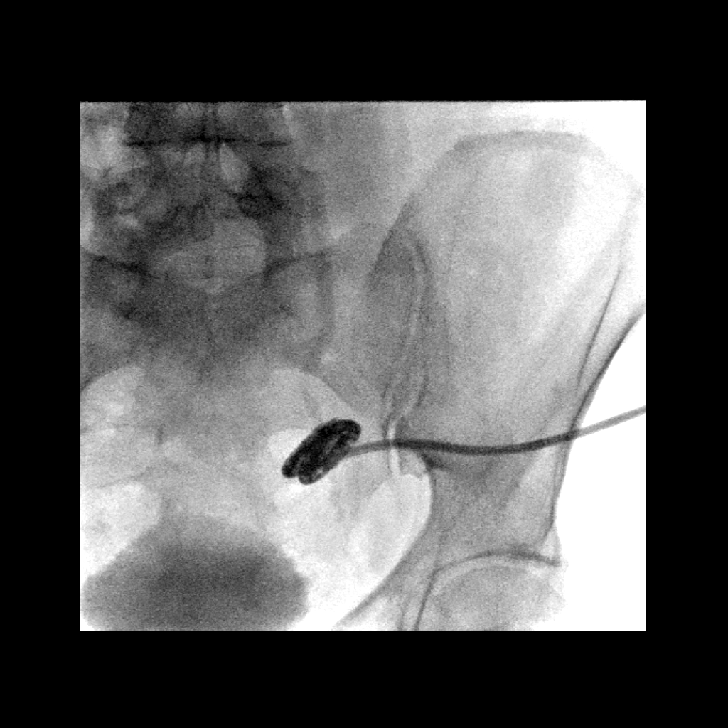
[frame 18/35]
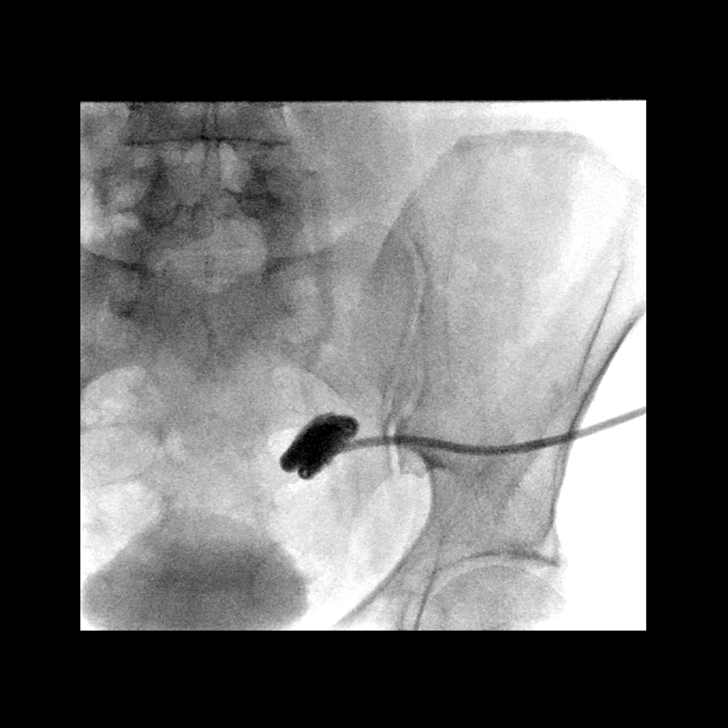
[frame 30/35]
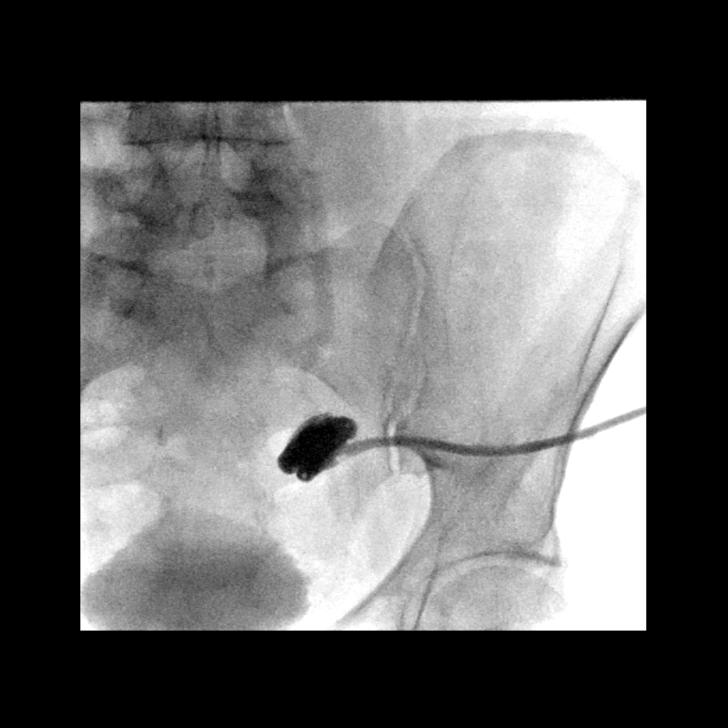

[Series 2: one shot · 0.15mm/px · 4 of 4 slices shown]
[im 1/4]
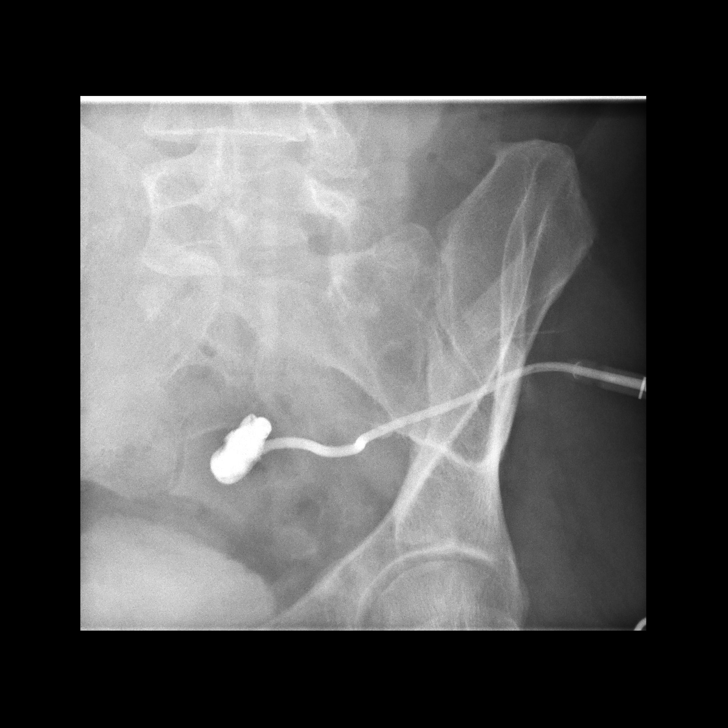
[im 2/4]
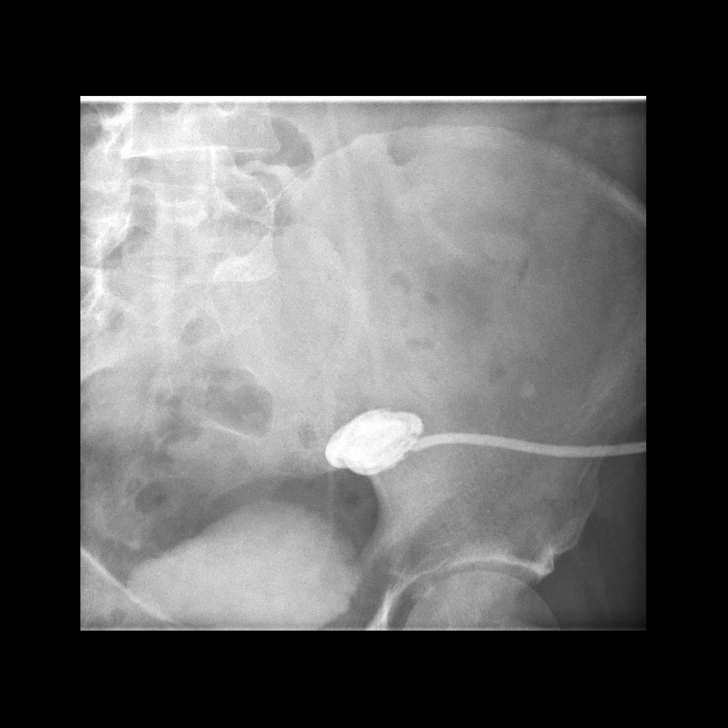
[im 3/4]
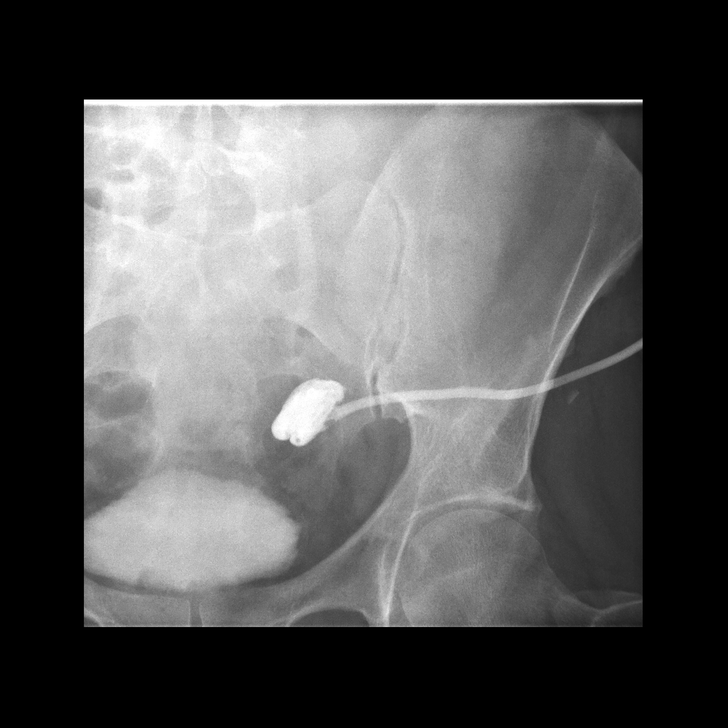
[im 4/4]
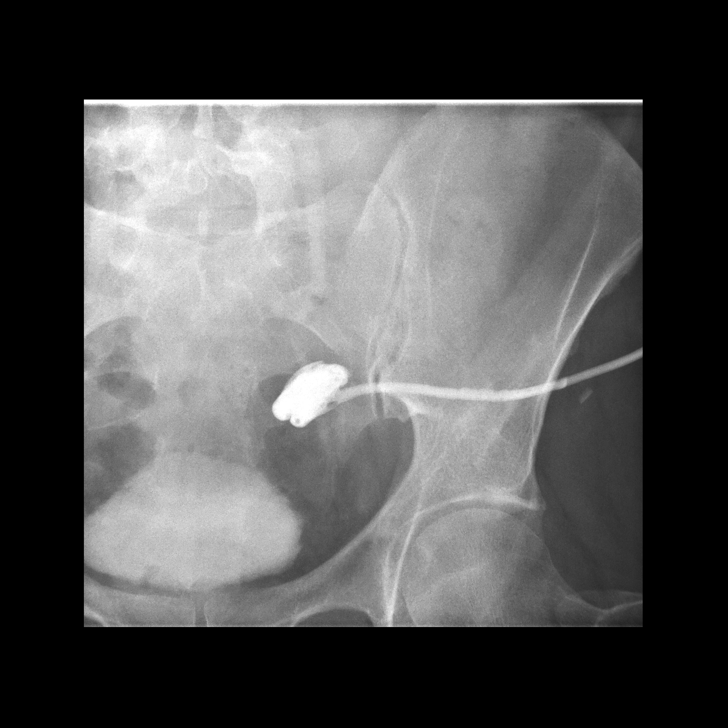

[8 of 8 positions shown; findings below may reference images not displayed]

EXAM:
DRAIN INJECTION

MEDICATIONS:
None

ANESTHESIA/SEDATION:
None

COMPLICATIONS:
None

PROCEDURE:
Informed written consent was obtained from the patient after a
thorough discussion of the procedural risks, benefits and
alternatives. All questions were addressed. Maximal Sterile Barrier
Technique was utilized including caps, mask, sterile gowns, sterile
gloves, sterile drape, hand hygiene and skin antiseptic. A timeout
was performed prior to the initiation of the procedure.

Patient positioned supine position on the fluoroscopy table. Scout
images were acquired.

Gentle contrast injection was performed with images acquired.

Patient tolerated the procedure well and remained hemodynamically
stable throughout.

At the completion gravity drainage was attached.
FINDINGS: No residual abscess cavity.  No fistula identified
IMPRESSION: Drain injection confirms no abscess cavity or fistula.

## 2020-04-19 IMAGING — CT CT ABD-PELV W/ CM
2 of 4 series · 13 of 46 positions shown, 15 images · IV contrast (iopamidol)
Comparison: [DATE] [DATE], [DATE], [DATE] [DATE], [DATE]

CLINICAL DATA: 53-year-old male with a history of diverticulitis,
status post drainage

EXAM:
CT ABDOMEN AND PELVIS WITH CONTRAST
TECHNIQUE: Multidetector CT imaging of the abdomen and pelvis was performed
using the standard protocol following bolus administration of
intravenous contrast.
CONTRAST:  100mL QFD5S8-L00 IOPAMIDOL (QFD5S8-L00) INJECTION 61%

[Series 2: abd pelvis 5.00 br40 s3 axial · axial · 0.66mm/px · z∈[+1318,+1763]mm · 10 of 107 slices shown, 12 images]
[im 9/107  soft-tissue]
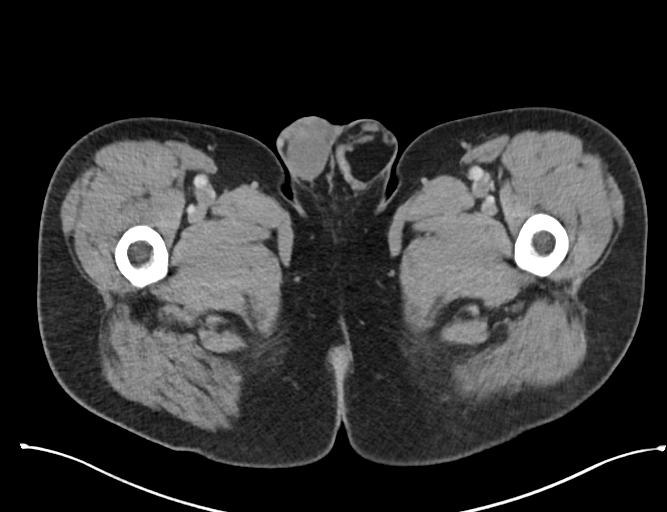
[im 9/107  bone]
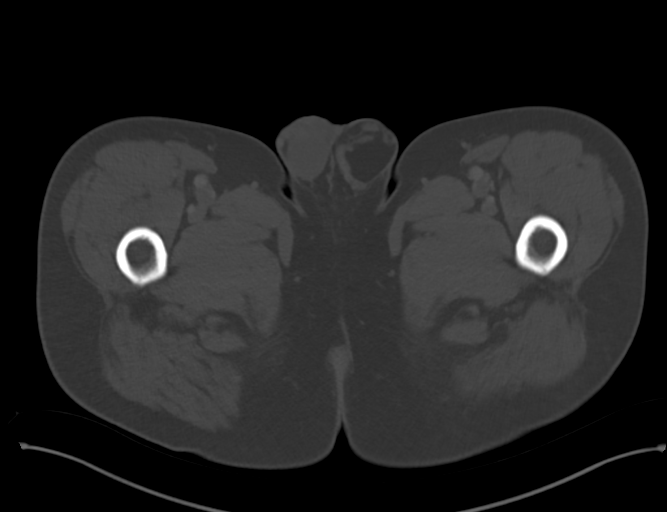
[im 18/107  soft-tissue]
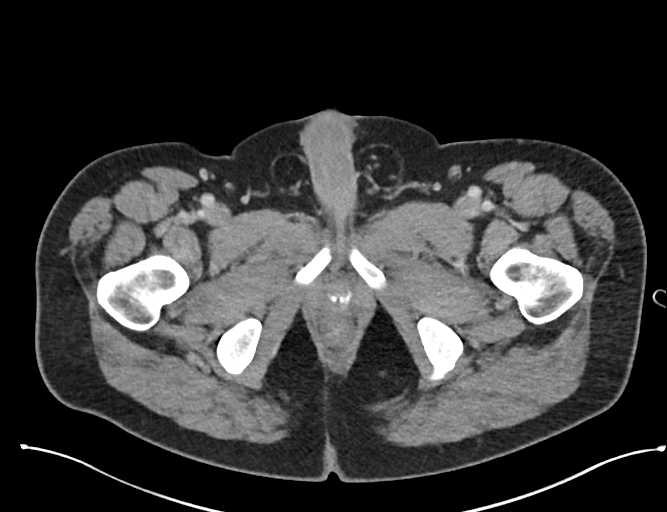
[im 27/107  soft-tissue]
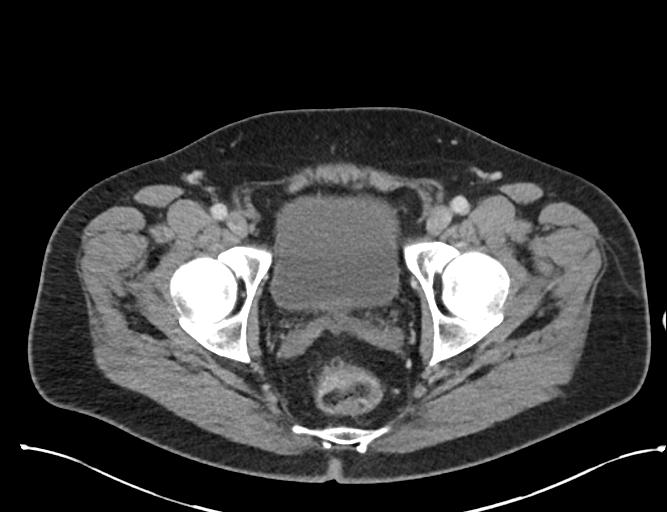
[im 40/107  soft-tissue]
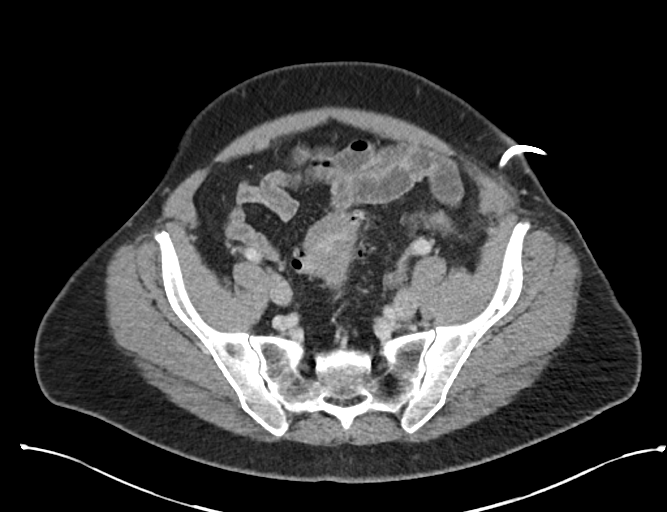
[im 49/107  soft-tissue]
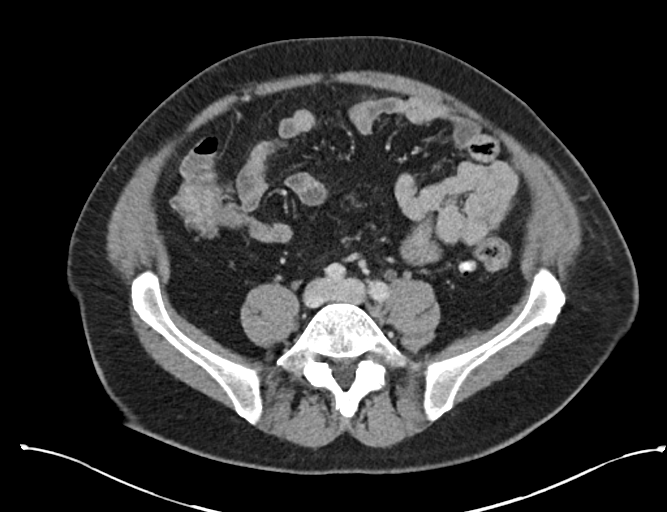
[im 58/107  soft-tissue]
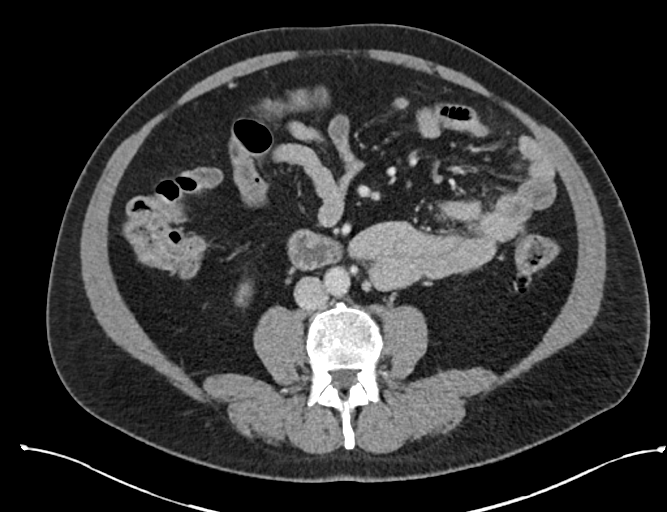
[im 67/107  soft-tissue]
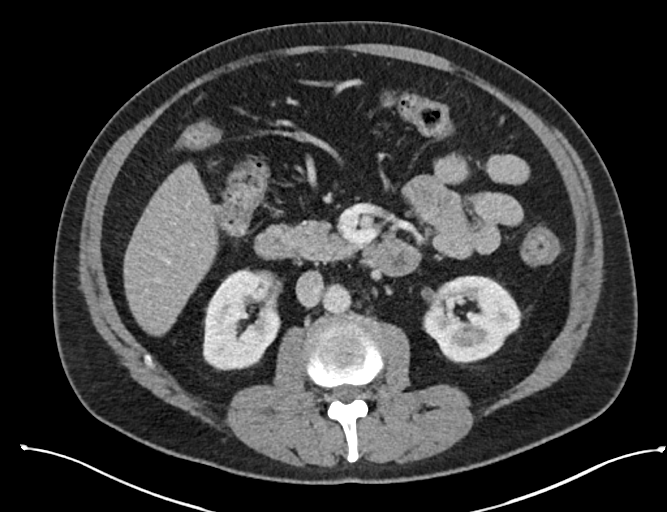
[im 80/107  soft-tissue]
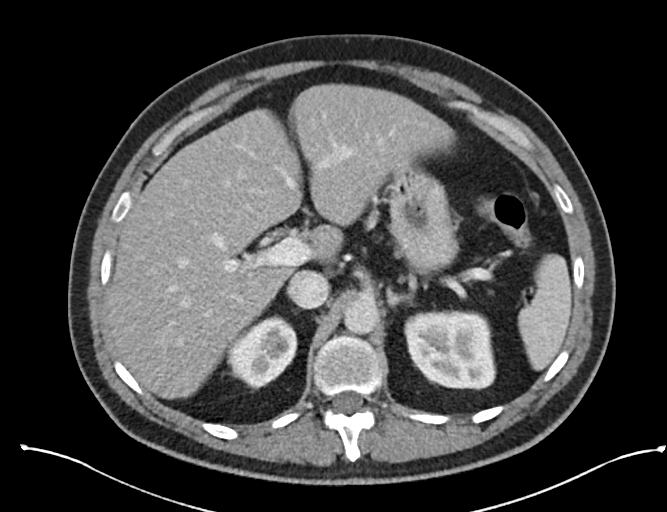
[im 89/107  soft-tissue]
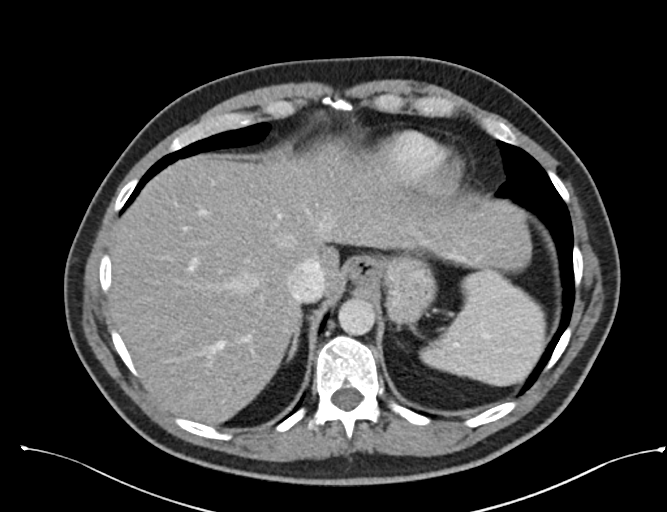
[im 89/107  bone]
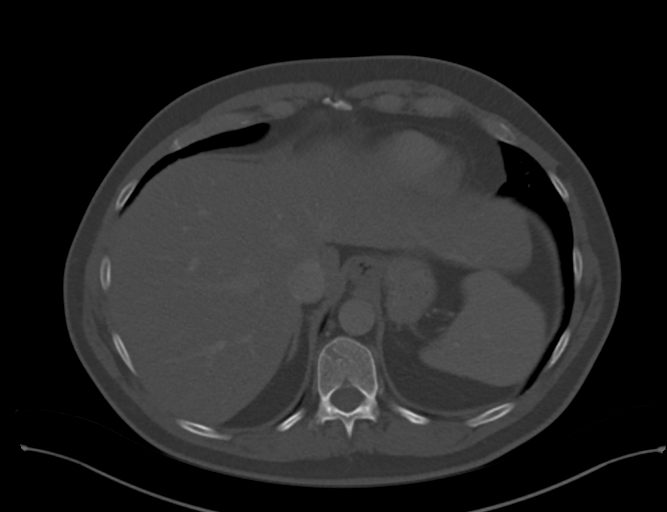
[im 98/107  soft-tissue]
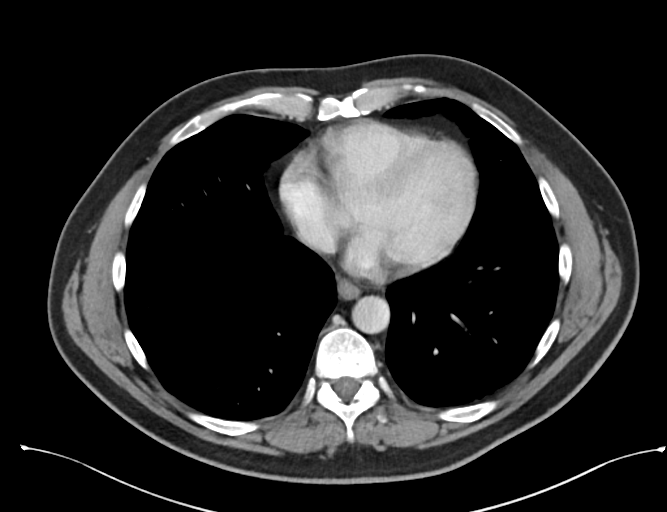

[Series 6: abd pelvis 2.00 br40 s3 cor · coronal · 0.82mm/px · 3 of 153 slices shown]
[im 51/153  soft-tissue]
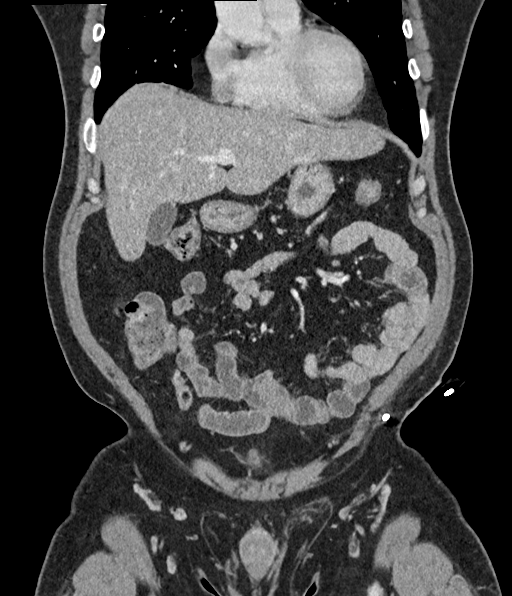
[im 68/153  soft-tissue]
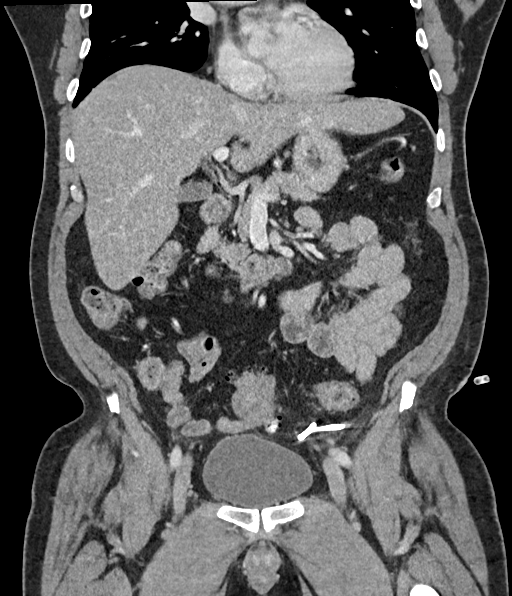
[im 85/153  soft-tissue]
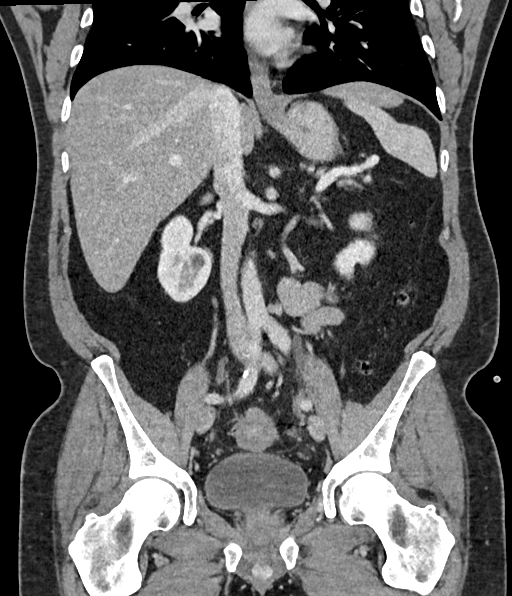

[13 of 46 positions shown; findings below may reference images not displayed]

FINDINGS: Lower chest: No acute finding

Hepatobiliary: Mild decreased attenuation of liver parenchyma,
likely representing steatosis. Sparing at the gallbladder fossa.
Unremarkable gallbladder.

Pancreas: Unremarkable

Spleen: Unremarkable

Adrenals/Urinary Tract: Unremarkable appearance of the adrenal
glands. No evidence of hydronephrosis of the left kidney. Small
hypodense lesion in the right anterior/inferior kidney cortex,
likely a benign cyst. No nephrolithiasis. Unremarkable course of the
bilateral ureters. Unremarkable appearance of the urinary bladder.

Stomach/Bowel: Unremarkable stomach.  Unremarkable small bowel.

Appendix is not visualized, however, no inflammatory changes are
present adjacent to the cecum to indicate an appendicitis.

No significant stool burden. Resolution of inflammatory changes of
the sigmoid colon, with decreasing vascular prominence and edema
within the mesentery.

Pigtail drainage catheter within the left pelvic sidewall at the
site of the prior abscess, with no residual abscess.

Vascular/Lymphatic: Atherosclerotic changes of the abdominal aorta
and iliac arteries. Iliac arteries and proximal femoral arteries
patent.

No adenopathy.

Reproductive: Calcifications of the prostate.

Other: Fat containing umbilical hernia. Bilateral inguinal fat
containing hernia

Musculoskeletal: No acute displaced fracture. Degenerative changes
of the spine.
IMPRESSION: Pigtail drainage catheter within the left pelvic sidewall, with
resolution of the prior abscess.

Resolved inflammatory changes of the sigmoid colon, with presence of
diverticular disease.

## 2021-04-29 DIAGNOSIS — F419 Anxiety disorder, unspecified: Secondary | ICD-10-CM | POA: Diagnosis not present

## 2021-04-29 DIAGNOSIS — I10 Essential (primary) hypertension: Secondary | ICD-10-CM | POA: Diagnosis not present

## 2021-04-29 DIAGNOSIS — F5104 Psychophysiologic insomnia: Secondary | ICD-10-CM | POA: Diagnosis not present

## 2021-06-06 DIAGNOSIS — I10 Essential (primary) hypertension: Secondary | ICD-10-CM | POA: Diagnosis not present

## 2021-06-06 DIAGNOSIS — F419 Anxiety disorder, unspecified: Secondary | ICD-10-CM | POA: Diagnosis not present
